# Patient Record
Sex: Male | Born: 1960 | Race: White | Hispanic: No | Marital: Married | Smoking: Current every day smoker
Health system: Southern US, Community
[De-identification: ages and names within clinical notes are randomized; demographics above are authoritative.]

## PROBLEM LIST (undated history)

## (undated) DIAGNOSIS — Z720 Tobacco use: Secondary | ICD-10-CM

## (undated) DIAGNOSIS — C419 Malignant neoplasm of bone and articular cartilage, unspecified: Secondary | ICD-10-CM

## (undated) DIAGNOSIS — C3490 Malignant neoplasm of unspecified part of unspecified bronchus or lung: Secondary | ICD-10-CM

## (undated) DIAGNOSIS — C801 Malignant (primary) neoplasm, unspecified: Secondary | ICD-10-CM

## (undated) HISTORY — DX: Tobacco use: Z72.0

---

## 2001-05-07 ENCOUNTER — Inpatient Hospital Stay (HOSPITAL_COMMUNITY): Admission: EM | Admit: 2001-05-07 | Discharge: 2001-05-09 | Payer: Self-pay | Admitting: Emergency Medicine

## 2001-05-07 ENCOUNTER — Encounter: Payer: Self-pay | Admitting: Emergency Medicine

## 2001-05-07 ENCOUNTER — Encounter: Payer: Self-pay | Admitting: Oral Surgery

## 2001-05-07 ENCOUNTER — Encounter: Payer: Self-pay | Admitting: *Deleted

## 2001-05-08 ENCOUNTER — Encounter: Payer: Self-pay | Admitting: Oral Surgery
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
## 2004-09-08 ENCOUNTER — Emergency Department (HOSPITAL_COMMUNITY): Admission: EM | Admit: 2004-09-08 | Discharge: 2004-09-08 | Payer: Self-pay | Admitting: Emergency Medicine

## 2009-11-26 ENCOUNTER — Emergency Department (HOSPITAL_COMMUNITY): Admission: EM | Admit: 2009-11-26 | Discharge: 2009-11-26 | Payer: Self-pay | Admitting: Emergency Medicine
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
## 2019-11-30 DIAGNOSIS — Z5321 Procedure and treatment not carried out due to patient leaving prior to being seen by health care provider: Secondary | ICD-10-CM | POA: Insufficient documentation

## 2019-11-30 DIAGNOSIS — Y92410 Unspecified street and highway as the place of occurrence of the external cause: Secondary | ICD-10-CM | POA: Insufficient documentation

## 2019-11-30 DIAGNOSIS — M542 Cervicalgia: Secondary | ICD-10-CM | POA: Insufficient documentation
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
## 2019-12-01 ENCOUNTER — Emergency Department
Admission: EM | Admit: 2019-12-01 | Discharge: 2019-12-01 | Disposition: A | Discharge status: Home / Self Care | Payer: Self-pay | Attending: Emergency Medicine | Admitting: Emergency Medicine

## 2019-12-01 ENCOUNTER — Emergency Department: Payer: Self-pay

## 2019-12-01 NOTE — ED Notes (Signed)
Patient reported he was leaving; patient encouraged to stay. Patient left.

## 2022-05-28 ENCOUNTER — Encounter (HOSPITAL_COMMUNITY): Payer: Self-pay

## 2022-05-28 ENCOUNTER — Emergency Department (HOSPITAL_COMMUNITY): Payer: Medicaid Other

## 2022-05-28 ENCOUNTER — Emergency Department (HOSPITAL_COMMUNITY)
Admission: EM | Admit: 2022-05-28 | Discharge: 2022-05-28 | Disposition: A | Discharge status: Home / Self Care | Payer: Medicaid Other | Attending: Emergency Medicine | Admitting: Emergency Medicine

## 2022-05-28 ENCOUNTER — Other Ambulatory Visit: Payer: Self-pay

## 2022-05-28 DIAGNOSIS — E871 Hypo-osmolality and hyponatremia: Secondary | ICD-10-CM | POA: Insufficient documentation

## 2022-05-28 DIAGNOSIS — F17200 Nicotine dependence, unspecified, uncomplicated: Secondary | ICD-10-CM | POA: Insufficient documentation

## 2022-05-28 DIAGNOSIS — R918 Other nonspecific abnormal finding of lung field: Secondary | ICD-10-CM | POA: Insufficient documentation

## 2022-05-28 DIAGNOSIS — M549 Dorsalgia, unspecified: Secondary | ICD-10-CM | POA: Diagnosis present

## 2022-05-28 LAB — CBC WITH DIFFERENTIAL/PLATELET
Abs Immature Granulocytes: 0.08 10*3/uL — ABNORMAL HIGH (ref 0.00–0.07)
Basophils Absolute: 0.2 10*3/uL — ABNORMAL HIGH (ref 0.0–0.1)
Basophils Relative: 1 %
Eosinophils Absolute: 1.2 10*3/uL — ABNORMAL HIGH (ref 0.0–0.5)
Eosinophils Relative: 10 %
HCT: 40.2 % (ref 39.0–52.0)
Hemoglobin: 13.8 g/dL (ref 13.0–17.0)
Immature Granulocytes: 1 %
Lymphocytes Relative: 13 %
Lymphs Abs: 1.6 10*3/uL (ref 0.7–4.0)
MCH: 31.7 pg (ref 26.0–34.0)
MCHC: 34.3 g/dL (ref 30.0–36.0)
MCV: 92.4 fL (ref 80.0–100.0)
Monocytes Absolute: 0.6 10*3/uL (ref 0.1–1.0)
Monocytes Relative: 5 %
Neutro Abs: 8.7 10*3/uL — ABNORMAL HIGH (ref 1.7–7.7)
Neutrophils Relative %: 70 %
Platelets: 525 10*3/uL — ABNORMAL HIGH (ref 150–400)
RBC: 4.35 MIL/uL (ref 4.22–5.81)
RDW: 12.9 % (ref 11.5–15.5)
WBC: 12.3 10*3/uL — ABNORMAL HIGH (ref 4.0–10.5)
nRBC: 0 % (ref 0.0–0.2)

## 2022-05-28 LAB — BASIC METABOLIC PANEL
Anion gap: 10 (ref 5–15)
BUN: 5 mg/dL — ABNORMAL LOW (ref 6–20)
CO2: 27 mmol/L (ref 22–32)
Calcium: 8.5 mg/dL — ABNORMAL LOW (ref 8.9–10.3)
Chloride: 86 mmol/L — ABNORMAL LOW (ref 98–111)
Creatinine, Ser: 0.73 mg/dL (ref 0.61–1.24)
GFR, Estimated: >60 mL/min (ref >60)
Glucose, Bld: 102 mg/dL — ABNORMAL HIGH (ref 70–99)
Potassium: 3.6 mmol/L (ref 3.5–5.1)
Sodium: 123 mmol/L — ABNORMAL LOW (ref 135–145)

## 2022-05-28 LAB — URINALYSIS, ROUTINE W REFLEX MICROSCOPIC
Bilirubin Urine: NEGATIVE
Glucose, UA: NEGATIVE mg/dL
Hgb urine dipstick: NEGATIVE
Ketones, ur: NEGATIVE mg/dL
Leukocytes,Ua: NEGATIVE
Nitrite: NEGATIVE
Protein, ur: NEGATIVE mg/dL
Specific Gravity, Urine: <1.005 — ABNORMAL LOW (ref 1.005–1.030)
pH: 5.5 (ref 5.0–8.0)

## 2022-05-28 MED ORDER — LACTATED RINGERS IV BOLUS
1000.0000 mL | Freq: Once | INTRAVENOUS | Status: AC
  Administered 2022-05-28: 1000 mL via INTRAVENOUS

## 2022-05-28 MED ORDER — IOHEXOL 300 MG/ML  SOLN
75.0000 mL | Freq: Once | INTRAMUSCULAR | Status: AC | PRN
  Administered 2022-05-28: 75 mL via INTRAVENOUS

## 2022-05-28 MED ORDER — KETOROLAC TROMETHAMINE 15 MG/ML IJ SOLN
15.0000 mg | Freq: Once | INTRAMUSCULAR | Status: AC
  Administered 2022-05-28: 15 mg via INTRAVENOUS
  Filled 2022-05-28: qty 1

## 2022-05-28 MED ORDER — OXYCODONE-ACETAMINOPHEN 5-325 MG PO TABS
2.0000 | ORAL_TABLET | Freq: Once | ORAL | Status: AC
  Administered 2022-05-28: 2 via ORAL
  Filled 2022-05-28: qty 2

## 2022-05-28 NOTE — ED Notes (Signed)
Pt ambulated to the bathroom unassisted.  

## 2022-05-28 NOTE — ED Notes (Signed)
ED Provider at bedside. 

## 2022-05-28 NOTE — Discharge Instructions (Signed)
As discussed, your CT scans today have suggested the possibility of cancer.  It is importantly follow-up with our oncology team.  They also have your information and may contact you tomorrow for follow-up care.  If you do not hear from the office tomorrow or Monday, please call to ensure appropriate ongoing care.  Return here for concerning changes in your condition.

## 2022-05-28 NOTE — ED Provider Notes (Signed)
[facility] Provider Note   CSN: [id number] Arrival date & time: 05/28/22  1401     History  Chief Complaint  Patient presents with   Back Pain    [name] Stone is a 62 y.o. male.  HPI Patient presents with his daughter who assists with history.  Patient has been feeling differently from normal for months, though he initially states only about 6 weeks.  He initially complains of left flank pain, but with additional questioning states that he has anorexia, weakness, fatigue, without clear precipitant.  He continues to smoke, drinks daily. He does not see a physician with any frequency.     Home Medications Prior to Admission medications   Medication Sig Start Date End Date Taking? Authorizing Provider  ibuprofen (ADVIL) 200 MG tablet Take 400 mg by mouth every 6 (six) hours as needed for moderate pain.   Yes [provider]      Allergies    Patient has no allergy information on record.    Review of Systems   Review of Systems  All other systems reviewed and are negative.   Physical Exam Updated Vital Signs BP (!) 150/84   Pulse 76   Temp 97.8 F (36.6 C) (Oral)   Resp 14   Ht '5\' 6"'$  (1.676 m)   Wt 59 kg   SpO2 95%   BMI 20.98 kg/m  Physical Exam Vitals and nursing note reviewed.  Constitutional:      Appearance: He is well-developed. He is ill-appearing.  HENT:     Head: Normocephalic and atraumatic.  Eyes:     Conjunctiva/sclera: Conjunctivae normal.  Cardiovascular:     Rate and Rhythm: Normal rate and regular rhythm.  Pulmonary:     Effort: Pulmonary effort is normal. No respiratory distress.     Breath sounds: No stridor.  Abdominal:     General: There is no distension.  Skin:    General: Skin is warm and dry.  Neurological:     Mental Status: He is oriented to person, place, and time.     ED Results / Procedures / Treatments   Labs (all labs ordered are listed, but only abnormal results  are displayed) Labs Reviewed  URINALYSIS, ROUTINE W REFLEX MICROSCOPIC - Abnormal; Notable for the following components:      Result Value   Specific Gravity, Urine <1.005 (*)    All other components within normal limits  CBC WITH DIFFERENTIAL/PLATELET - Abnormal; Notable for the following components:   WBC 12.3 (*)    Platelets 525 (*)    Neutro Abs 8.7 (*)    Eosinophils Absolute 1.2 (*)    Basophils Absolute 0.2 (*)    Abs Immature Granulocytes 0.08 (*)    All other components within normal limits  BASIC METABOLIC PANEL - Abnormal; Notable for the following components:   Sodium 123 (*)    Chloride 86 (*)    Glucose, Bld 102 (*)    BUN 5 (*)    Calcium 8.5 (*)    All other components within normal limits    EKG None  Radiology CT Chest W Contrast  Result Date: 05/28/2022 CLINICAL DATA:  Abnormal lung nodule EXAM: CT CHEST WITH CONTRAST TECHNIQUE: Multidetector CT imaging of the chest was performed during intravenous contrast administration. RADIATION DOSE REDUCTION: This exam was performed according to the departmental dose-optimization program which includes automated exposure control, adjustment of the mA and/or kV according to patient size and/or  use of iterative reconstruction technique. CONTRAST:  56m OMNIPAQUE IOHEXOL 300 MG/ML  SOLN COMPARISON:  CT renal stone 05/28/2022 chest x-ray report only 11/26/2009. FINDINGS: Cardiovascular: No significant vascular findings. Normal heart size. No pericardial effusion. There are atherosclerotic calcifications of the aorta. Mediastinum/Nodes: There is paratracheal lymphadenopathy measuring up to 12 mm short axis. There is subcarinal lymphadenopathy measuring up to 15 mm short axis. There is right hilar lymphadenopathy measuring up to 13 mm short axis. There is anterior mediastinal lymphadenopathy measuring up to 1 cm. The esophagus and visualized thyroid gland are within normal limits. Lungs/Pleura: There are numerous bilateral pulmonary  nodules, right greater than left. Some nodules are adjacent and confluent peripherally. This is most significant in the subpleural region adjacent to the posterior right hemithorax extending from the level of the aortic arch distally into the lung base best seen on coronal image 5/78. There is a small layering right pleural effusion. Largest focal nodule is seen in the right middle lobe measuring 4.4 by 2.7 cm. Largest left-sided pulmonary nodule is seen in the left upper lobe and is cavitary measuring 11 mm image 4/70. Other smaller left-sided pulmonary nodules are all solid. Upper Abdomen: No acute abnormality. Musculoskeletal: There is a lytic lesion in the right fourth rib with adjacent pleural thickening. There is a lytic lesion in the T11 vertebral body with minimal pathologic fracture of the superior endplate. Chronic mild compression deformities of T3, T8 and T9. IMPRESSION: 1. Numerous bilateral pulmonary nodules, right greater than left, worrisome for metastatic disease. 2. There are more confluence larger masslike densities throughout the right lung peripherally which may represent primary neoplasm of the lung or pleura. 3. Small layering right pleural effusion. 4. Mediastinal and right hilar lymphadenopathy. 5. Lytic lesions in the right fourth rib and T11 vertebral body worrisome for metastatic disease. 6. Minimal pathologic fracture of the superior endplate of [id number] Aortic Atherosclerosis (ICD10-I70.0). Electronically Signed   By: ARonney AstersM.D.   On: 05/28/2022 22:23   CT Renal Stone Study  Result Date: 05/28/2022 CLINICAL DATA:  Abdominal/flank pain, stone suspected EXAM: CT ABDOMEN AND PELVIS WITHOUT CONTRAST TECHNIQUE: Multidetector CT imaging of the abdomen and pelvis was performed following the standard protocol without IV contrast. RADIATION DOSE REDUCTION: This exam was performed according to the departmental dose-optimization program which includes automated exposure control, adjustment  of the mA and/or kV according to patient size and/or use of iterative reconstruction technique. COMPARISON:  None Available. FINDINGS: Lower chest: Multiple bilateral pulmonary nodules with as an example a 9 x 8 mm right middle lobe and 10 x 8 mm left lower lobe pulmonary nodules. Subpleural right nodular like densities of the as an example a 25 x 8 mm nodule (2:1). Trace right pleural effusion. Prominent but nonenlarged retrocrural lymph nodes. Hepatobiliary: No focal liver abnormality. No gallstones, gallbladder wall thickening, or pericholecystic fluid. No biliary dilatation. Pancreas: No focal lesion. Normal pancreatic contour. No surrounding inflammatory changes. No main pancreatic ductal dilatation. Spleen: Normal in size without focal abnormality. Adrenals/Urinary Tract: No adrenal nodule bilaterally. No nephrolithiasis and no hydronephrosis. No definite contour-deforming renal mass. No ureterolithiasis or hydroureter. The urinary bladder is unremarkable. Stomach/Bowel: Stomach is within normal limits. No evidence of bowel wall thickening or dilatation. Scattered colonic diverticulosis. Appendix appears normal. Vascular/Lymphatic: No abdominal aorta or iliac aneurysm. Severe atherosclerotic plaque of the aorta and its branches. No abdominal, pelvic, or inguinal lymphadenopathy. Reproductive: Unremarkable prostate gland. Other: No intraperitoneal free fluid. No intraperitoneal free gas. No organized fluid collection.  Musculoskeletal: No abdominal wall hernia or abnormality. Lytic lesion of the L2 vertebral body with associated pathologic compression fracture through the superior endplate leading to a at least 35% vertebral body height loss. Mixed sclerotic and lytic lesion of the L1 lamina and pedicle. IMPRESSION: 1. Findings concerning for metastatic malignancy. 2. Bilateral lung pulmonary nodules as well as right subpleural nodules concerning for metastases. Recommend CT chest with intravenous contrast for  further evaluation. 3. Prominent but nonenlarged retrocrural lymph nodes. 4. Suggestion of osseous metastases with an L2 pathologic compression fracture with underlying lytic lesion. Mixed sclerotic and lytic lesion of the L1 lamina and pedicle. 5. Colonic diverticulosis with no acute diverticulitis. 6.  Aortic Atherosclerosis (ICD10-I70.0). Electronically Signed   By: Iven Finn M.D.   On: 05/28/2022 19:39    Procedures Procedures    Medications Ordered in ED Medications  oxyCODONE-acetaminophen (PERCOCET/ROXICET) 5-325 MG per tablet 2 tablet (2 tablets Oral Given 05/28/22 1954)  ketorolac (TORADOL) 15 MG/ML injection 15 mg (15 mg Intravenous Given 05/28/22 1953)  lactated ringers bolus 1,000 mL (0 mLs Intravenous Stopped 05/28/22 2201)  iohexol (OMNIPAQUE) 300 MG/ML solution 75 mL (75 mLs Intravenous Contrast Given 05/28/22 2151)    ED Course/ Medical Decision Making/ A&P                             Medical Decision Making Adult male, smoker, drinker presents with his daughter.  Patient's complaints are concerning for renal dysfunction initially but with additional systemic complaints, malignancy was a consideration as well as infection. Initial findings concerning for electrolyte abnormalities, possible pulmonary nodules, prompting CT scan. On monitor the patient has cardiac rhythm 75 sinus normal Pulse ox 100% room air normal   Amount and/or Complexity of Data Reviewed Independent Historian: caregiver Labs: ordered. Decision-making details documented in ED Course. Radiology: ordered and independent interpretation performed. Decision-making details documented in ED Course.  Risk Prescription drug management. Decision regarding hospitalization.   11:33 PM Patient calm, awake, alert, ambulatory, in no distress, no new oxygen requirement, no hemodynamic instability.  I reviewed his labs, x-ray, CT with him and his daughter.  We had a lengthy conversation about the likelihood of  malignancy based on CT scan findings. Patient does have mild hyponatremia, most values are otherwise generally reassuring and the hyponatremia is likely chronic given the absence of any focal neurodeficits, and his description of essential poor nutrition during his illness.  Patient's CT scan is also notable for multiple areas of likely metastatic disease, including possible bone involvement, likely contributing to his description of pain on presentation. We discussed the importance of close outpatient follow-up to further delineate possible malignancy.  I have messaged our oncology colleague to attempt to increase the likelihood of follow-up as well.        Final Clinical Impression(s) / ED Diagnoses Final diagnoses:  Lung mass    Rx / DC Orders ED Discharge Orders     None         Carmin Muskrat, MD 05/28/22 2333

## 2022-05-28 NOTE — ED Triage Notes (Signed)
Complaining of back pain on the rt side at the level of the kidneys that started a month ago and is sharp now.  Denies any blood or dark urine.

## 2022-05-28 NOTE — ED Provider Triage Note (Signed)
Emergency Medicine Provider Triage Evaluation Note  [name] Stone , a 62 y.o. male  was evaluated in triage.  Pt complains of right lower back pain has been ongoing for couple days intermittently.  Described as a sharp sensation.  Patient denies any history of kidney stones but his daughter does suffer from kidney stones.  He does have urinary urgency but denies any dysuria, hematuria, or frequency.  Pain does not radiate.  No nausea or vomiting.  Review of Systems  Positive:  Negative: See above   Physical Exam  BP (!) 147/94 (BP Location: Right Arm)   Pulse 76   Temp 97.6 F (36.4 C) (Oral)   Resp 18   Ht '5\' 6"'$  (1.676 m)   Wt 59 kg   SpO2 100%   BMI 20.98 kg/m  Gen:   Awake, no distress   Resp:  Normal effort  MSK:   Moves extremities without difficulty  Other:  Right CVA tenderness.  No midline lumbar tenderness.  Medical Decision Making  Medically screening exam initiated at 5:58 PM.  Appropriate orders placed.  Kirke Shaggy was informed that the remainder of the evaluation will be completed by another provider, this initial triage assessment does not replace that evaluation, and the importance of remaining in the ED until their evaluation is complete.     Myna Bright [place], [place] 05/28/22 1759

## 2022-05-28 NOTE — ED Notes (Signed)
Pt back from CT. Pt ambulated to the bathroom unassisted and back

## 2022-06-01 ENCOUNTER — Other Ambulatory Visit: Payer: Self-pay

## 2022-06-01 DIAGNOSIS — C3490 Malignant neoplasm of unspecified part of unspecified bronchus or lung: Secondary | ICD-10-CM

## 2022-06-01 NOTE — Progress Notes (Signed)
Arne Cleveland, MD  Derek Jack, MD Cc: Donita Brooks D Do you plan PET-CT? Would be helpful to assess for safer biopsy sites than lung  Thx DDH

## 2022-06-01 NOTE — Progress Notes (Signed)
Order placed for PET scan and brain MRI per Dr. Delton Coombes

## 2022-06-01 NOTE — Progress Notes (Signed)
Order placed for CT biopsy per Dr. Katragadda. 

## 2022-06-02 ENCOUNTER — Ambulatory Visit (HOSPITAL_COMMUNITY)
Admission: RE | Admit: 2022-06-02 | Discharge: 2022-06-02 | Disposition: A | Discharge status: Home / Self Care | Payer: Medicaid Other | Source: Ambulatory Visit | Attending: Hematology | Admitting: Hematology

## 2022-06-02 DIAGNOSIS — C3490 Malignant neoplasm of unspecified part of unspecified bronchus or lung: Secondary | ICD-10-CM | POA: Diagnosis not present

## 2022-06-02 MED ORDER — GADOBUTROL 1 MMOL/ML IV SOLN
6.5000 mL | Freq: Once | INTRAVENOUS | Status: AC | PRN
  Administered 2022-06-02: 6.5 mL via INTRAVENOUS

## 2022-06-04 ENCOUNTER — Ambulatory Visit (HOSPITAL_COMMUNITY)
Admission: RE | Admit: 2022-06-04 | Discharge: 2022-06-04 | Disposition: A | Discharge status: Home / Self Care | Payer: Medicaid Other | Source: Ambulatory Visit | Attending: Hematology | Admitting: Hematology

## 2022-06-04 DIAGNOSIS — C3490 Malignant neoplasm of unspecified part of unspecified bronchus or lung: Secondary | ICD-10-CM | POA: Insufficient documentation

## 2022-06-04 MED ORDER — FLUDEOXYGLUCOSE F - 18 (FDG) INJECTION
7.0300 | Freq: Once | INTRAVENOUS | Status: AC | PRN
  Administered 2022-06-04: 7.03 via INTRAVENOUS

## 2022-06-07 DIAGNOSIS — C799 Secondary malignant neoplasm of unspecified site: Secondary | ICD-10-CM | POA: Insufficient documentation

## 2022-06-08 ENCOUNTER — Inpatient Hospital Stay: Payer: Medicaid Other | Attending: Hematology | Admitting: Hematology

## 2022-06-08 ENCOUNTER — Encounter: Payer: Self-pay | Admitting: Hematology

## 2022-06-08 VITALS — BP 186/93 | HR 68 | Temp 97.0°F | Resp 18 | Ht 66.0 in | Wt 109.0 lb

## 2022-06-08 DIAGNOSIS — C799 Secondary malignant neoplasm of unspecified site: Secondary | ICD-10-CM

## 2022-06-08 DIAGNOSIS — J90 Pleural effusion, not elsewhere classified: Secondary | ICD-10-CM | POA: Diagnosis not present

## 2022-06-08 DIAGNOSIS — F17210 Nicotine dependence, cigarettes, uncomplicated: Secondary | ICD-10-CM | POA: Diagnosis not present

## 2022-06-08 DIAGNOSIS — R39198 Other difficulties with micturition: Secondary | ICD-10-CM | POA: Diagnosis not present

## 2022-06-08 DIAGNOSIS — Z801 Family history of malignant neoplasm of trachea, bronchus and lung: Secondary | ICD-10-CM | POA: Insufficient documentation

## 2022-06-08 DIAGNOSIS — C7951 Secondary malignant neoplasm of bone: Secondary | ICD-10-CM | POA: Diagnosis not present

## 2022-06-08 DIAGNOSIS — C3490 Malignant neoplasm of unspecified part of unspecified bronchus or lung: Secondary | ICD-10-CM | POA: Insufficient documentation

## 2022-06-08 LAB — BASIC METABOLIC PANEL
Anion gap: 8 (ref 5–15)
BUN: 6 mg/dL (ref 6–20)
CO2: 27 mmol/L (ref 22–32)
Calcium: 8.4 mg/dL — ABNORMAL LOW (ref 8.9–10.3)
Chloride: 93 mmol/L — ABNORMAL LOW (ref 98–111)
Creatinine, Ser: 0.75 mg/dL (ref 0.61–1.24)
GFR, Estimated: >60 mL/min (ref >60)
Glucose, Bld: 95 mg/dL (ref 70–99)
Potassium: 4.4 mmol/L (ref 3.5–5.1)
Sodium: 128 mmol/L — ABNORMAL LOW (ref 135–145)

## 2022-06-08 LAB — OSMOLALITY: Osmolality: 278 mOsm/kg (ref 275–295)

## 2022-06-08 MED ORDER — TAMSULOSIN HCL 0.4 MG PO CAPS: 0.4000 mg | ORAL_CAPSULE | Freq: Every day | ORAL | 1 refills | Status: AC

## 2022-06-08 NOTE — Patient Instructions (Addendum)
[facility]  Discharge Instructions   You were seen and examined today by Dr. Delton Coombes. Dr. Delton Coombes is a medical oncologist, meaning that he specializes in the treatment of cancer diagnoses. Dr. Delton Coombes discussed your past medical history, family history of cancers, and the events that led to you being here today.  You were referred to Dr. Delton Coombes due to an abnormal CT scan that resulted in a PET scan and brain MRI.   The PET scan revealed numerous nodules in your lungs, lymph nodes and bones. This is highly concerning for cancer.  Your brain MRI was clean. There is no signs of cancer spread to the brain.  Dr. Delton Coombes has recommended a biopsy. We will arrange for this as quickly as possible. Following the biopsy, Dr. Delton Coombes has recommended additional testing on the tissue removed.  Dr. Delton Coombes has recommended additional blood work today.  Follow-up as scheduled.  Thank you for choosing [facility] to provide your oncology and hematology care.   To afford each patient quality time with our provider, please arrive at least 15 minutes before your scheduled appointment time. You may need to reschedule your appointment if you arrive late (10 or more minutes). Arriving late affects you and other patients whose appointments are after yours.  Also, if you miss three or more appointments without notifying the office, you may be dismissed from the clinic at the provider's discretion.    Again, thank you for choosing [facility].  Our hope is that these requests will decrease the amount of time that you wait before being seen by our physicians.   If you have a lab appointment with the [facility] please come in thru the Main Entrance and check in at the main information desk.           _____________________________________________________________  Should you have questions after your visit to [facility], please contact our office at [phone] and follow the prompts.  Our office hours are 8:00 a.m. to 4:30 p.m. Monday - Thursday and 8:00 a.m. to 2:30 p.m. Friday.  Please note that voicemails left after 4:00 p.m. may not be returned until the following business day.  We are closed weekends and all major holidays.  You do have access to a nurse 24-7, just call the main number to the clinic [phone] and do not press any options, hold on the line and a nurse will answer the phone.    For prescription refill requests, have your pharmacy contact our office and allow 72 hours.    Masks are optional in the cancer centers. If you would like for your care team to wear a mask while they are taking care of you, please let them know. You may have one support person who is at least 61 years old accompany you for your appointments.

## 2022-06-08 NOTE — Progress Notes (Signed)
[name] [address], [place] [address]   Clinic Day:  06/08/2022  Referring physician: No ref. provider found  Patient Care Team: Patient, No Pcp Per as PCP - General (General Practice) Derek Jack, MD as Medical Oncologist (Medical Oncology) Brien Mates, RN as Oncology Nurse Navigator (Medical Oncology)   ASSESSMENT & PLAN:   Assessment:  1.  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  He was seen in the ER on 05/28/2022.  20 pound weight loss in 6 months.  No hemoptysis. - CT renal study (05/28/2022): Bilateral lung nodules, L2 pathologic compression fracture, L1 lamina and pedicle lesion. - CT chest (05/28/2022): Numerous bilateral lung nodules, right more than left.  Pleural thickening concerning for primary neoplasm of the lung or pleura.  Small layering right pleural effusion.  Mediastinal and right hilar lymphadenopathy.  Lytic lesions in the right fourth rib and T11 vertebral body. - MRI brain (06/02/2022): No metastatic disease. - PET scan (06/08/2022): Innumerable bilateral hypermetabolic lung nodules, extensive right pleural nodularity/pleural rind.  Hypermetabolic right supraclavicular, right axillary, right hilar, infrahilar lower paraesophageal adenopathy.  Hypermetabolic bone lesions-right anterior fourth rib, T11 vertebral body, L1 vertebral body, L2 vertebral body.  L2 lesion has posterior cortical breakthrough and suspected small amount of epidural tumor.  2.  Social/family history: - He lives by himself at home and is independent of ADLs and IADLs.  He is a retired Engineer, building services.  Current active smoker, 1 pack/day for 40 years.  No exposure to asbestos. - Father died of lung cancer and was smoker.  Paternal uncle had cancer, type not known to the patient.  Plan:  1.  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 - Recommend biopsy of the right supraclavicular/axillary lymph node, US guided by IR. - Recommend NGS testing.  Will also send blood NGS testing. - RTC after above.  2.  Low back pain: - Will make a referral to radiation oncology.  He will require MRI of the lumbar spine with and without contrast.  2.  Difficulty urination: - Will start him on Flomax 0.4 mg daily.   Orders Placed This Encounter  Procedures   Basic metabolic panel    Standing Status:   Future    Number of Occurrences:   1    Standing Expiration Date:   06/08/2023   Osmolality    Standing Status:   Future    Number of Occurrences:   1    Standing Expiration Date:   06/08/2023     I,Alexis Herring,acting as a scribe for Derek Jack, MD.,have documented all relevant documentation on the behalf of Derek Jack, MD,as directed by  Derek Jack, MD while in the presence of Derek Jack, MD.   I, Derek Jack MD, have reviewed the above documentation for accuracy and completeness, and I agree with the above.   Derek Jack, MD   3/25/202412:33 PM  CHIEF COMPLAINT/PURPOSE OF CONSULT:   Diagnosis: lung nodules with osseous lytic lesion   Cancer Staging  No matching staging information was found for the patient.   Prior Therapy: none  Current Therapy:  pending work-up  HISTORY OF PRESENT ILLNESS:   Oncology History   No history exists.     [name] Stone is a 62 y.o. male presenting to clinic today for evaluation of lung  nodules at the request of EM Dr. Carmin Muskrat.  Patient was seen in the ED on 05/28/22 for back pain with accompanying decreased appetite, fatigue, and weakness x2-3 months. While there he had a CT renal stone scan which revealed bilateral lung pulmonary nodules as well as right subpleural nodules concerning for metastases and suggestion of osseous metastases with an L2 pathologic compression fracture with underlying lytic lesion. While  in the ED his sodium was 123. He received 1L of lactated ringers at that time.  Today, he reports that's he has had this back pain for the last several months but it has worsened in the last few weeks. He reports that he has lost 20lbs unintentionally in the last 6 months. However, he reports having a good appetite. His appetite level is at 80%. His energy level is at 40%. He reports that he has had difficulty urinating for the last few weeks. He states that he stays well hydrated. Patient states that he has not noticed much of a change in his energy levels since losing the weight. He denies any chest pain, hemoptysis, headaches, vision changes, recent infections, numbness or tingling in his hands or feet, nausea, or vomiting.  He takes Advil for his back pain but does not take any other medications on a regular basis. He states that when his pain becomes severe he can only sleep for a few hours at a time.  He lives at home alone. He is able to perform all of his ADLs independently- he drives, shops, and does chores without assistance.  He smokes 1ppd and started smoking 40 years. He denies any known asbestos exposures.   His father passed away from lung cancer. His paternal uncle has an unknown type of cancer. He denies any other known family history of cancer.  He denies any Hx of heart attacks or strokes.  He previously worked as a Engineer, building services.  PAST MEDICAL HISTORY:   Past Medical History: Past Medical History:  Diagnosis Date   Tobacco abuse     Surgical History: No past surgical history on file.  Social History: Social History   Socioeconomic History   Marital status: Married    Spouse name: Not on file   Number of children: Not on file   Years of education: Not on file   Highest education level: Not on file  Occupational History   Not on file  Tobacco Use   Smoking status: Every Day    Packs/day: 1    Types: Cigarettes   Smokeless tobacco: Not on file   Substance and Sexual Activity   Alcohol use: Not on file   Drug use: Not on file   Sexual activity: Not on file  Other Topics Concern   Not on file  Social History Narrative   Not on file   Social Determinants of Health   Financial Resource Strain: Not on file  Food Insecurity: No Food Insecurity (06/08/2022)   Hunger Vital Sign    Worried About Running Out of Food in the Last Year: Never true    Ran Out of Food in the Last Year: Never true  Transportation Needs: No Transportation Needs (06/08/2022)   PRAPARE - Hydrologist (Medical): No    Lack of Transportation (Non-Medical): No  Physical Activity: Not on file  Stress: Not on file  Social Connections: Not on file  Intimate Partner Violence: Not At Risk (06/08/2022)   Humiliation, Afraid, Rape, and Kick questionnaire  Fear of Current or Ex-Partner: No    Emotionally Abused: No    Physically Abused: No    Sexually Abused: No    Family History: No family history on file.  Current Medications:  Current Outpatient Medications:    tamsulosin (FLOMAX) 0.4 MG CAPS capsule, Take 1 capsule (0.4 mg total) by mouth daily after supper., Disp: 30 capsule, Rfl: 1   ibuprofen (ADVIL) 200 MG tablet, Take 400 mg by mouth every 6 (six) hours as needed for moderate pain., Disp: , Rfl:    Allergies: Not on File  REVIEW OF SYSTEMS:   Review of Systems  Constitutional:  Positive for fatigue and unexpected weight change. Negative for chills and fever.  HENT:   Negative for lump/mass, mouth sores, nosebleeds, sore throat and trouble swallowing.   Eyes:  Negative for eye problems.  Respiratory:  Negative for cough and shortness of breath.   Cardiovascular:  Negative for chest pain, leg swelling and palpitations.  Gastrointestinal:  Negative for abdominal pain, constipation, diarrhea, nausea and vomiting.  Genitourinary:  Positive for difficulty urinating. Negative for bladder incontinence, dysuria, frequency,  hematuria and nocturia.   Musculoskeletal:  Positive for back pain. Negative for arthralgias, flank pain, myalgias and neck pain.  Skin:  Negative for itching and rash.  Neurological:  Negative for dizziness, headaches and numbness.  Hematological:  Does not bruise/bleed easily.  Psychiatric/Behavioral:  Positive for sleep disturbance. Negative for depression and suicidal ideas. The patient is not nervous/anxious.   All other systems reviewed and are negative.    VITALS:   Blood pressure (!) 186/93, pulse 68, temperature (!) 97 F (36.1 C), temperature source Tympanic, resp. rate 18, height 5\' 6"  (1.676 m), weight 109 lb (49.4 kg), SpO2 100 %.  Wt Readings from Last 3 Encounters:  06/08/22 109 lb (49.4 kg)  05/28/22 130 lb (59 kg)    Body mass index is 17.59 kg/m.  Performance status (ECOG): 1 - Symptomatic but completely ambulatory  PHYSICAL EXAM:   Physical Exam Vitals and nursing note reviewed. Exam conducted with a chaperone present.  Constitutional:      Appearance: Normal appearance.  Cardiovascular:     Rate and Rhythm: Normal rate and regular rhythm.     Pulses: Normal pulses.     Heart sounds: Normal heart sounds.  Pulmonary:     Effort: Pulmonary effort is normal.     Breath sounds: Normal breath sounds.  Abdominal:     Palpations: Abdomen is soft. There is no hepatomegaly, splenomegaly or mass.     Tenderness: There is no abdominal tenderness.  Musculoskeletal:     Right lower leg: No edema.     Left lower leg: No edema.  Lymphadenopathy:     Cervical: No cervical adenopathy.     Right cervical: No superficial, deep or posterior cervical adenopathy.    Left cervical: No superficial, deep or posterior cervical adenopathy.     Upper Body:     Right upper body: Axillary adenopathy present. No supraclavicular adenopathy.     Left upper body: No supraclavicular or axillary adenopathy.     Comments: Right supraclavicular lymph node not palpable Small 1cm right  axillary lymph node  Neurological:     General: No focal deficit present.     Mental Status: He is alert and oriented to person, place, and time.  Psychiatric:        Mood and Affect: Mood normal.        Behavior: Behavior normal.  LABS:      Latest Ref Rng & Units 05/28/2022    6:21 PM  CBC  WBC 4.0 - 10.5 K/uL 12.3   Hemoglobin 13.0 - 17.0 g/dL 13.8   Hematocrit 39.0 - 52.0 % 40.2   Platelets 150 - 400 K/uL 525       Latest Ref Rng & Units 06/08/2022    8:53 AM 05/28/2022    6:21 PM  CMP  Glucose 70 - 99 mg/dL 95  102   BUN 6 - 20 mg/dL 6  5   Creatinine 0.61 - 1.24 mg/dL 0.75  0.73   Sodium 135 - 145 mmol/L 128  123   Potassium 3.5 - 5.1 mmol/L 4.4  3.6   Chloride 98 - 111 mmol/L 93  86   CO2 22 - 32 mmol/L 27  27   Calcium 8.9 - 10.3 mg/dL 8.4  8.5      No results found for: "CEA1", "CEA" / No results found for: "CEA1", "CEA" No results found for: "PSA1" No results found for: "[id number]" No results found for: "CAN125"  No results found for: "TOTALPROTELP", "ALBUMINELP", "A1GS", "A2GS", "BETS", "BETA2SER", "GAMS", "MSPIKE", "SPEI" No results found for: "TIBC", "FERRITIN", "IRONPCTSAT" No results found for: "LDH"   STUDIES:   NM PET Image Initial (PI) Skull Base To Thigh  Addendum Date: 06/05/2022   ADDENDUM REPORT: 06/05/2022 14:16 ADDENDUM: The original report was by Dr. Van Clines. The following addendum is by Dr. Van Clines: Please note that activity in the right hand and wrist region does not have an accompanying CT abnormality and is thought to be injection related, although the FDG injection is documented as being in the LEFT hand. Given the normal and symmetric appearance in this region on the CT data I am highly skeptical of tumor involvement in the right hand and wrist, and would only suggest follow up imaging such as MRI if the patient has specific symptoms related to the right hand/wrist. Electronically Signed   By: Van Clines M.D.    On: 06/05/2022 14:16   Result Date: 06/05/2022 CLINICAL DATA:  Initial treatment strategy for non-small cell lung cancer. EXAM: NUCLEAR MEDICINE PET SKULL BASE TO THIGH TECHNIQUE: 7.0 mCi F-18 FDG was injected intravenously. Full-ring PET imaging was performed from the skull base to thigh after the radiotracer. CT data was obtained and used for attenuation correction and anatomic localization. Fasting blood glucose: 82 mg/dl COMPARISON:  Multiple exams, including CT examinations from 05/28/2022 FINDINGS: Mediastinal blood pool activity: SUV max 2.6 Liver activity: SUV max NA NECK: No significant abnormal hypermetabolic activity in this region. Incidental CT findings: Periventricular white matter and corona radiata hypodensities favor chronic ischemic microvascular white matter disease. Bilateral common carotid atherosclerotic calcification. CHEST: Innumerable hypermetabolic bilateral pulmonary nodules with extensive right pleural nodularity with rind like and nodular pleural densities on the right compatible with malignant pleural involvement right upper lobe medial paramediastinal nodule 2.7 by 1.8 cm with maximum SUV 13.8. Right lower lobe posteromedial pleural rind measures 6.8 by 1.4 cm on image 78 series 7 with maximum SUV 19.7. Index 1 cm left lower lobe pulmonary nodule on image 91 series 7 has maximum SUV of 6.5. Hypermetabolic right supraclavicular, paratracheal, right axillary, right hilar, right infrahilar, and right lower paraesophageal adenopathy noted. The index right paratracheal node measuring 1.2 cm in short axis on image 58 series 7 has maximum SUV of 17.0. Low-grade activity along the trace right pleural effusion which is probably malignant. Incidental CT findings: Atherosclerotic  calcification of the thoracic aorta and branch vessels. Minimal atherosclerotic calcification of the left main coronary artery. ABDOMEN/PELVIS: No significant abnormal hypermetabolic activity in this region. Incidental  CT findings: Atherosclerosis is present, including aortoiliac atherosclerotic disease. SKELETON: Several hypermetabolic osseous metastatic lesions are observed. A destructive lesion of the right anterior fourth rib has maximum SUV of 13.7 cm. A mixed density lesion of the T11 vertebral body has a maximum SUV of 19.8. A mixed density but partially destructive lesion of the right L1 pedicle, transverse process, spinous process, and lamina has maximum SUV of 12.8. A mixed density but primarily lytic lesion of the left L2 vertebral body and pedicle has a maximum SUV of 15.2 with posterior cortical breakthrough and a suspected small amount of epidural tumor. Activity in the right hand and wrist is thought to be injection related. Incidental CT findings: Prior compressions at T9 and L2, the L2 compression of the superior endplate may be pathologic in nature. Old healed sternal body fracture. IMPRESSION: 1. Innumerable bilateral hypermetabolic pulmonary nodules and extensive right pleural nodularity/pleural rind compatible with malignant pleural involvement. 2. Hypermetabolic right supraclavicular, right axillary, right hilar, right infrahilar, and right lower paraesophageal adenopathy. 3. Hypermetabolic osseous metastatic lesions involving the right anterior fourth rib, T11 vertebral body, L1 vertebral body, and L2 vertebral body. The L2 lesion has posterior cortical breakthrough and a suspected small amount of epidural tumor. 4. Trace right pleural effusion, probably malignant. 5. Periventricular white matter and corona radiata hypodensities favoring chronic ischemic microvascular white matter disease. 6. Atherosclerosis. 7. Old T9 and L2 compression fractures. Old healed sternal body fracture. Aortic Atherosclerosis (ICD10-I70.0). Electronically Signed: By: Van Clines M.D. On: 06/05/2022 11:57   MR Brain W Wo Contrast  Result Date: 06/05/2022 CLINICAL DATA:  Non-small cell lung cancer (NSCLC), staging EXAM:  MRI HEAD WITHOUT AND WITH CONTRAST TECHNIQUE: Multiplanar, multiecho pulse sequences of the brain and surrounding structures were obtained without and with intravenous contrast. CONTRAST:  6.106mL GADAVIST GADOBUTROL 1 MMOL/ML IV SOLN COMPARISON:  CT head May 07, 2001. FINDINGS: Brain: No acute infarction, hemorrhage, hydrocephalus, extra-axial collection or mass lesion. Small remote lacunar infarcts in the right thalamus. Small remote perforator infarct in the left basal ganglia. Patchy T2/FLAIR hyperintensity within the white matter, compatible with chronic microvascular ischemic change. No pathologic enhancement. Vascular: Major arterial flow voids are maintained skull base. Skull and upper cervical spine: Normal marrow signal. Sinuses/Orbits: Negative. Other: Nonspecific cyst along the left nasal arch, likely benign. IMPRESSION: No evidence of acute intracranial abnormality or metastatic disease. Electronically Signed   By: Margaretha Sheffield M.D.   On: 06/05/2022 11:37   CT Chest W Contrast  Result Date: 05/28/2022 CLINICAL DATA:  Abnormal lung nodule EXAM: CT CHEST WITH CONTRAST TECHNIQUE: Multidetector CT imaging of the chest was performed during intravenous contrast administration. RADIATION DOSE REDUCTION: This exam was performed according to the departmental dose-optimization program which includes automated exposure control, adjustment of the mA and/or kV according to patient size and/or use of iterative reconstruction technique. CONTRAST:  51mL OMNIPAQUE IOHEXOL 300 MG/ML  SOLN COMPARISON:  CT renal stone 05/28/2022 chest x-ray report only 11/26/2009. FINDINGS: Cardiovascular: No significant vascular findings. Normal heart size. No pericardial effusion. There are atherosclerotic calcifications of the aorta. Mediastinum/Nodes: There is paratracheal lymphadenopathy measuring up to 12 mm short axis. There is subcarinal lymphadenopathy measuring up to 15 mm short axis. There is right hilar  lymphadenopathy measuring up to 13 mm short axis. There is anterior mediastinal lymphadenopathy measuring up to 1  cm. The esophagus and visualized thyroid gland are within normal limits. Lungs/Pleura: There are numerous bilateral pulmonary nodules, right greater than left. Some nodules are adjacent and confluent peripherally. This is most significant in the subpleural region adjacent to the posterior right hemithorax extending from the level of the aortic arch distally into the lung base best seen on coronal image 5/78. There is a small layering right pleural effusion. Largest focal nodule is seen in the right middle lobe measuring 4.4 by 2.7 cm. Largest left-sided pulmonary nodule is seen in the left upper lobe and is cavitary measuring 11 mm image 4/70. Other smaller left-sided pulmonary nodules are all solid. Upper Abdomen: No acute abnormality. Musculoskeletal: There is a lytic lesion in the right fourth rib with adjacent pleural thickening. There is a lytic lesion in the T11 vertebral body with minimal pathologic fracture of the superior endplate. Chronic mild compression deformities of T3, T8 and T9. IMPRESSION: 1. Numerous bilateral pulmonary nodules, right greater than left, worrisome for metastatic disease. 2. There are more confluence larger masslike densities throughout the right lung peripherally which may represent primary neoplasm of the lung or pleura. 3. Small layering right pleural effusion. 4. Mediastinal and right hilar lymphadenopathy. 5. Lytic lesions in the right fourth rib and T11 vertebral body worrisome for metastatic disease. 6. Minimal pathologic fracture of the superior endplate of 624THL. Aortic Atherosclerosis (ICD10-I70.0). Electronically Signed   By: Ronney Asters M.D.   On: 05/28/2022 22:23   CT Renal Stone Study  Result Date: 05/28/2022 CLINICAL DATA:  Abdominal/flank pain, stone suspected EXAM: CT ABDOMEN AND PELVIS WITHOUT CONTRAST TECHNIQUE: Multidetector CT imaging of the  abdomen and pelvis was performed following the standard protocol without IV contrast. RADIATION DOSE REDUCTION: This exam was performed according to the departmental dose-optimization program which includes automated exposure control, adjustment of the mA and/or kV according to patient size and/or use of iterative reconstruction technique. COMPARISON:  None Available. FINDINGS: Lower chest: Multiple bilateral pulmonary nodules with as an example a 9 x 8 mm right middle lobe and 10 x 8 mm left lower lobe pulmonary nodules. Subpleural right nodular like densities of the as an example a 25 x 8 mm nodule (2:1). Trace right pleural effusion. Prominent but nonenlarged retrocrural lymph nodes. Hepatobiliary: No focal liver abnormality. No gallstones, gallbladder wall thickening, or pericholecystic fluid. No biliary dilatation. Pancreas: No focal lesion. Normal pancreatic contour. No surrounding inflammatory changes. No main pancreatic ductal dilatation. Spleen: Normal in size without focal abnormality. Adrenals/Urinary Tract: No adrenal nodule bilaterally. No nephrolithiasis and no hydronephrosis. No definite contour-deforming renal mass. No ureterolithiasis or hydroureter. The urinary bladder is unremarkable. Stomach/Bowel: Stomach is within normal limits. No evidence of bowel wall thickening or dilatation. Scattered colonic diverticulosis. Appendix appears normal. Vascular/Lymphatic: No abdominal aorta or iliac aneurysm. Severe atherosclerotic plaque of the aorta and its branches. No abdominal, pelvic, or inguinal lymphadenopathy. Reproductive: Unremarkable prostate gland. Other: No intraperitoneal free fluid. No intraperitoneal free gas. No organized fluid collection. Musculoskeletal: No abdominal wall hernia or abnormality. Lytic lesion of the L2 vertebral body with associated pathologic compression fracture through the superior endplate leading to a at least 35% vertebral body height loss. Mixed sclerotic and lytic  lesion of the L1 lamina and pedicle. IMPRESSION: 1. Findings concerning for metastatic malignancy. 2. Bilateral lung pulmonary nodules as well as right subpleural nodules concerning for metastases. Recommend CT chest with intravenous contrast for further evaluation. 3. Prominent but nonenlarged retrocrural lymph nodes. 4. Suggestion of osseous metastases with an  L2 pathologic compression fracture with underlying lytic lesion. Mixed sclerotic and lytic lesion of the L1 lamina and pedicle. 5. Colonic diverticulosis with no acute diverticulitis. 6.  Aortic Atherosclerosis (ICD10-I70.0). Electronically Signed   By: Iven Finn M.D.   On: 05/28/2022 19:39

## 2022-06-11 NOTE — Progress Notes (Signed)
Arne Cleveland, MD  Donita Brooks D PROCEDURE / BIOPSY REVIEW Date: 06/10/22  Requested Biopsy site: Pleural mass Reason for request: r/o met, staging, molecular studies Imaging review: Best seen on CT  Decision: Approved Imaging modality to perform: CT Schedule with: Moderate Sedation Schedule for: Any VIR  Additional comments:   Please contact me with questions, concerns, or if issue pertaining to this request arise.  Dayne Arne Cleveland, MD [facility]

## 2022-06-24 ENCOUNTER — Other Ambulatory Visit: Payer: Self-pay | Admitting: Student

## 2022-06-24 ENCOUNTER — Other Ambulatory Visit: Payer: Self-pay | Admitting: Radiology

## 2022-06-24 DIAGNOSIS — R918 Other nonspecific abnormal finding of lung field: Secondary | ICD-10-CM

## 2022-06-25 ENCOUNTER — Ambulatory Visit (HOSPITAL_COMMUNITY)
Admission: RE | Admit: 2022-06-25 | Discharge: 2022-06-25 | Disposition: A | Discharge status: Home / Self Care | Payer: Medicaid Other | Source: Ambulatory Visit | Attending: Interventional Radiology | Admitting: Interventional Radiology

## 2022-06-25 ENCOUNTER — Ambulatory Visit (HOSPITAL_COMMUNITY): Admission: RE | Admit: 2022-06-25 | Payer: Medicaid Other | Source: Ambulatory Visit

## 2022-06-25 ENCOUNTER — Other Ambulatory Visit: Payer: Self-pay

## 2022-06-25 ENCOUNTER — Ambulatory Visit (HOSPITAL_COMMUNITY)
Admission: RE | Admit: 2022-06-25 | Discharge: 2022-06-25 | Disposition: A | Discharge status: Home / Self Care | Payer: Medicaid Other | Source: Ambulatory Visit | Attending: Hematology | Admitting: Hematology

## 2022-06-25 DIAGNOSIS — F17210 Nicotine dependence, cigarettes, uncomplicated: Secondary | ICD-10-CM | POA: Insufficient documentation

## 2022-06-25 DIAGNOSIS — C3491 Malignant neoplasm of unspecified part of right bronchus or lung: Secondary | ICD-10-CM | POA: Diagnosis not present

## 2022-06-25 DIAGNOSIS — R918 Other nonspecific abnormal finding of lung field: Secondary | ICD-10-CM | POA: Diagnosis present

## 2022-06-25 DIAGNOSIS — C3490 Malignant neoplasm of unspecified part of unspecified bronchus or lung: Secondary | ICD-10-CM

## 2022-06-25 LAB — PROTIME-INR
INR: 1.1 (ref 0.8–1.2)
Prothrombin Time: 13.8 seconds (ref 11.4–15.2)

## 2022-06-25 LAB — CBC
HCT: 40.3 % (ref 39.0–52.0)
Hemoglobin: 13.7 g/dL (ref 13.0–17.0)
MCH: 31.4 pg (ref 26.0–34.0)
MCHC: 34 g/dL (ref 30.0–36.0)
MCV: 92.2 fL (ref 80.0–100.0)
Platelets: 316 10*3/uL (ref 150–400)
RBC: 4.37 MIL/uL (ref 4.22–5.81)
RDW: 14.2 % (ref 11.5–15.5)
WBC: 10.6 10*3/uL — ABNORMAL HIGH (ref 4.0–10.5)
nRBC: 0 % (ref 0.0–0.2)

## 2022-06-25 MED ORDER — FENTANYL CITRATE (PF) 100 MCG/2ML IJ SOLN
INTRAMUSCULAR | Status: AC
  Filled 2022-06-25: qty 2

## 2022-06-25 MED ORDER — METOPROLOL TARTRATE 5 MG/5ML IV SOLN
INTRAVENOUS | Status: AC
  Filled 2022-06-25: qty 5

## 2022-06-25 MED ORDER — HYDRALAZINE HCL 20 MG/ML IJ SOLN
INTRAMUSCULAR | Status: AC
  Filled 2022-06-25: qty 1

## 2022-06-25 MED ORDER — FENTANYL CITRATE (PF) 100 MCG/2ML IJ SOLN
INTRAMUSCULAR | Status: AC | PRN
  Administered 2022-06-25: 25 ug via INTRAVENOUS

## 2022-06-25 MED ORDER — MIDAZOLAM HCL 2 MG/2ML IJ SOLN
INTRAMUSCULAR | Status: AC | PRN
  Administered 2022-06-25: .5 mg via INTRAVENOUS

## 2022-06-25 MED ORDER — METOPROLOL TARTRATE 50 MG PO TABS
ORAL_TABLET | ORAL | Status: AC
  Filled 2022-06-25: qty 2

## 2022-06-25 MED ORDER — SODIUM CHLORIDE 0.9 % IV SOLN: INTRAVENOUS | Status: DC

## 2022-06-25 MED ORDER — MIDAZOLAM HCL 2 MG/2ML IJ SOLN
INTRAMUSCULAR | Status: AC
  Filled 2022-06-25: qty 2

## 2022-06-25 MED ORDER — METOPROLOL TARTRATE 5 MG/5ML IV SOLN
5.0000 mg | Freq: Once | INTRAVENOUS | Status: AC
  Administered 2022-06-25: 5 mg via INTRAVENOUS
  Filled 2022-06-25: qty 5

## 2022-06-25 MED ORDER — METOPROLOL TARTRATE 5 MG/5ML IV SOLN
5.0000 mg | Freq: Once | INTRAVENOUS | Status: AC
  Administered 2022-06-25: 5 mg via INTRAVENOUS

## 2022-06-25 MED ORDER — METOPROLOL TARTRATE 100 MG PO TABS
100.0000 mg | ORAL_TABLET | Freq: Once | ORAL | Status: AC
  Administered 2022-06-25: 100 mg via ORAL

## 2022-06-25 MED ORDER — HYDRALAZINE HCL 20 MG/ML IJ SOLN
20.0000 mg | Freq: Once | INTRAMUSCULAR | Status: AC
  Administered 2022-06-25: 10 mg via INTRAVENOUS

## 2022-06-25 NOTE — Progress Notes (Signed)
Spoke to Dr. Milford Cage about CXR results, Dr. Milford Cage stated that he was going to ordera repeat chest X ray and to wait until that xray is complete and read before discharge

## 2022-06-25 NOTE — H&P (Signed)
Chief Complaint: [name] was seen in consultation today for suspected metastatic lung cancer  Referring Physician(s): Katragadda,Sreedhar  Supervising Physician: Roanna Banning  [name] Status: [facility] - Out-pt  History of Present Illness: [name] [name] is a 62 y.o. male with PMH significant for tobacco abuse being seen today in relation to newly discovered lung nodules concerning for lung cancer. [name] [name] presented to [facility] ED on 05/28/22 for back pain. Imaging at that time revealed bilateral pulmonary nodules, and follow-up PET scan on 3/21 showed hypermetabolic activity of numerous lung nodules, as well as multiple suspected bone metastases. [name] has been seen by Dr Ellin Saba from Oncology service and has been referred to IR for image-guided lung biopsy.   Past Medical History:  Diagnosis Date   Tobacco abuse     No past surgical history on file.  Allergies: [name] has no allergy information on record.  Medications: Prior to Admission medications   Medication Sig Start Date End Date Taking? Authorizing Provider  ibuprofen (ADVIL) 200 MG tablet Take 400 mg by mouth every 6 (six) hours as needed for moderate pain.    [provider]  tamsulosin (FLOMAX) 0.4 MG CAPS capsule Take 1 capsule (0.4 mg total) by mouth daily after supper. 06/08/22   Doreatha Massed, MD     No family history on file.  Social History   Socioeconomic History   Marital status: Married    Spouse name: Not on file   Number of children: Not on file   Years of education: Not on file   Highest education level: Not on file  Occupational History   Not on file  Tobacco Use   Smoking status: Every Day    Packs/day: 1    Types: Cigarettes   Smokeless tobacco: Not on file  Substance and Sexual Activity   Alcohol use: Not on file   Drug use: Not on file   Sexual activity: Not on file  Other Topics Concern   Not on file  Social History Narrative   Not on file   Social  Determinants of Health   Financial Resource Strain: Not on file  Food Insecurity: No Food Insecurity (06/08/2022)   Hunger Vital Sign    Worried About Running Out of Food in [name] Last Year: Never true    Ran Out of Food in [name] Last Year: Never true  Transportation Needs: No Transportation Needs (06/08/2022)   PRAPARE - Administrator, Civil Service (Medical): No    Lack of Transportation (Non-Medical): No  Physical Activity: Not on file  Stress: Not on file  Social Connections: Not on file    Code Status: Full Code  Review of Systems: A 12 point ROS discussed and pertinent positives are indicated in [name] HPI above.  All other systems are negative.  Review of Systems  Constitutional:  Negative for chills and fever.  Respiratory:  Negative for chest tightness and shortness of breath.   Cardiovascular:  Negative for chest pain and leg swelling.  Gastrointestinal:  Positive for abdominal pain. Negative for diarrhea, nausea and vomiting.  Musculoskeletal:  Positive for back pain.  Neurological:  Negative for dizziness and headaches.  Psychiatric/Behavioral:  Negative for confusion.     Vital Signs: BP (!) 186/86   Pulse 75   Temp (!) 97.1 F (36.2 C)   Resp 17   Ht  (1.676 m)   Wt 104 lb (47.2 kg)   SpO2 100%   BMI 16.79 kg/m  Physical Exam Vitals reviewed.  Constitutional:      General: [name] [name] is not in acute distress.    Appearance: [name] [name] is ill-appearing.  Cardiovascular:     Rate and Rhythm: Normal rate and regular rhythm.     Pulses: Normal pulses.     Heart sounds: Normal heart sounds.  Pulmonary:     Effort: Pulmonary effort is normal.     Breath sounds: Wheezing present.  Abdominal:     General: Bowel sounds are normal.     Palpations: Abdomen is soft.     Tenderness: There is abdominal tenderness.  Musculoskeletal:     Right lower leg: No edema.     Left lower leg: No edema.  Skin:    General: Skin is warm and dry.  Neurological:      Mental Status: [name] [name] is alert and oriented to person, place, and time.  Psychiatric:        Mood and Affect: Mood normal.        Behavior: Behavior normal.     Imaging: NM PET Image Initial (PI) Skull Base To Thigh  Addendum Date: 06/05/2022   ADDENDUM REPORT: 06/05/2022 14:16 ADDENDUM: [name] original report was by Dr. Gaylyn Rong. [name] following addendum is by Dr. Gaylyn Rong: Please note that activity in [name] right hand and wrist region does not have an accompanying CT abnormality and is thought to be injection related, although [name] FDG injection is documented as being in [name] LEFT hand. Given [name] normal and symmetric appearance in this region on [name] CT data I am highly skeptical of tumor involvement in [name] right hand and wrist, and would only suggest follow up imaging such as MRI if [name] [name] has specific symptoms related to [name] right hand/wrist. Electronically Signed   By: Gaylyn Rong M.D.   On: 06/05/2022 14:16   Result Date: 06/05/2022 CLINICAL DATA:  Initial treatment strategy for non-small cell lung cancer. EXAM: NUCLEAR MEDICINE PET SKULL BASE TO THIGH TECHNIQUE: 7.0 mCi F-18 FDG was injected intravenously. Full-ring PET imaging was performed from [name] skull base to thigh after [name] radiotracer. CT data was obtained and used for attenuation correction and anatomic localization. Fasting blood glucose: 82 mg/dl COMPARISON:  Multiple exams, including CT examinations from 05/28/2022 FINDINGS: Mediastinal blood pool activity: SUV max 2.6 Liver activity: SUV max NA NECK: No significant abnormal hypermetabolic activity in this region. Incidental CT findings: Periventricular white matter and corona radiata hypodensities favor chronic ischemic microvascular white matter disease. Bilateral common carotid atherosclerotic calcification. CHEST: Innumerable hypermetabolic bilateral pulmonary nodules with extensive right pleural nodularity with rind like and nodular pleural densities on [name] right  compatible with malignant pleural involvement right upper lobe medial paramediastinal nodule 2.7 by 1.8 cm with maximum SUV 13.8. Right lower lobe posteromedial pleural rind measures 6.8 by 1.4 cm on image 78 series 7 with maximum SUV 19.7. Index 1 cm left lower lobe pulmonary nodule on image 91 series 7 has maximum SUV of 6.5. Hypermetabolic right supraclavicular, paratracheal, right axillary, right hilar, right infrahilar, and right lower paraesophageal adenopathy noted. [name] index right paratracheal node measuring 1.2 cm in short axis on image 58 series 7 has maximum SUV of 17.0. Low-grade activity along [name] trace right pleural effusion which is probably malignant. Incidental CT findings: Atherosclerotic calcification of [name] thoracic aorta and branch vessels. Minimal atherosclerotic calcification of [name] left main coronary artery. ABDOMEN/PELVIS: No significant abnormal hypermetabolic activity in this region. Incidental CT findings: Atherosclerosis is present, including aortoiliac atherosclerotic disease. SKELETON:  Several hypermetabolic osseous metastatic lesions are observed. A destructive lesion of [name] right anterior fourth rib has maximum SUV of 13.7 cm. A mixed density lesion of [name] T11 vertebral body has a maximum SUV of 19.8. A mixed density but partially destructive lesion of [name] right L1 pedicle, transverse process, spinous process, and lamina has maximum SUV of 12.8. A mixed density but primarily lytic lesion of [name] left L2 vertebral body and pedicle has a maximum SUV of 15.2 with posterior cortical breakthrough and a suspected small amount of epidural tumor. Activity in [name] right hand and wrist is thought to be injection related. Incidental CT findings: Prior compressions at T9 and L2, [name] L2 compression of [name] superior endplate may be pathologic in nature. Old healed sternal body fracture. IMPRESSION: 1. Innumerable bilateral hypermetabolic pulmonary nodules and extensive right pleural  nodularity/pleural rind compatible with malignant pleural involvement. 2. Hypermetabolic right supraclavicular, right axillary, right hilar, right infrahilar, and right lower paraesophageal adenopathy. 3. Hypermetabolic osseous metastatic lesions involving [name] right anterior fourth rib, T11 vertebral body, L1 vertebral body, and L2 vertebral body. [name] L2 lesion has posterior cortical breakthrough and a suspected small amount of epidural tumor. 4. Trace right pleural effusion, probably malignant. 5. Periventricular white matter and corona radiata hypodensities favoring chronic ischemic microvascular white matter disease. 6. Atherosclerosis. 7. Old T9 and L2 compression fractures. Old healed sternal body fracture. Aortic Atherosclerosis (ICD10-I70.0). Electronically Signed: By: Gaylyn Rong M.D. On: 06/05/2022 11:57   MR Brain W Wo Contrast  Result Date: 06/05/2022 CLINICAL DATA:  Non-small cell lung cancer (NSCLC), staging EXAM: MRI HEAD WITHOUT AND WITH CONTRAST TECHNIQUE: Multiplanar, multiecho pulse sequences of [name] brain and surrounding structures were obtained without and with intravenous contrast. CONTRAST:  6.28mL GADAVIST GADOBUTROL 1 MMOL/ML IV SOLN COMPARISON:  CT head May 07, 2001. FINDINGS: Brain: No acute infarction, hemorrhage, hydrocephalus, extra-axial collection or mass lesion. Small remote lacunar infarcts in [name] right thalamus. Small remote perforator infarct in [name] left basal ganglia. Patchy T2/FLAIR hyperintensity within [name] white matter, compatible with chronic microvascular ischemic change. No pathologic enhancement. Vascular: Major arterial flow voids are maintained skull base. Skull and upper cervical spine: Normal marrow signal. Sinuses/Orbits: Negative. Other: Nonspecific cyst along [name] left nasal arch, likely benign. IMPRESSION: No evidence of acute intracranial abnormality or metastatic disease. Electronically Signed   By: Feliberto Harts M.D.   On: 06/05/2022 11:37   CT  Chest W Contrast  Result Date: 05/28/2022 CLINICAL DATA:  Abnormal lung nodule EXAM: CT CHEST WITH CONTRAST TECHNIQUE: Multidetector CT imaging of [name] chest was performed during intravenous contrast administration. RADIATION DOSE REDUCTION: This exam was performed according to [name] departmental dose-optimization program which includes automated exposure control, adjustment of the mA and/or kV according to [name] size and/or use of iterative reconstruction technique. CONTRAST:  75mL OMNIPAQUE IOHEXOL 300 MG/ML  SOLN COMPARISON:  CT renal stone 05/28/2022 chest x-ray report only 11/26/2009. FINDINGS: Cardiovascular: No significant vascular findings. Normal heart size. No pericardial effusion. There are atherosclerotic calcifications of [name] aorta. Mediastinum/Nodes: There is paratracheal lymphadenopathy measuring up to 12 mm short axis. There is subcarinal lymphadenopathy measuring up to 15 mm short axis. There is right hilar lymphadenopathy measuring up to 13 mm short axis. There is anterior mediastinal lymphadenopathy measuring up to 1 cm. [name] esophagus and visualized thyroid gland are within normal limits. Lungs/Pleura: There are numerous bilateral pulmonary nodules, right greater than left. Some nodules are adjacent and confluent peripherally. This is most significant in [name] subpleural region adjacent  to [name] posterior right hemithorax extending from [name] level of [name] aortic arch distally into [name] lung base best seen on coronal image 5/78. There is a small layering right pleural effusion. Largest focal nodule is seen in [name] right middle lobe measuring 4.4 by 2.7 cm. Largest left-sided pulmonary nodule is seen in [name] left upper lobe and is cavitary measuring 11 mm image 4/70. Other smaller left-sided pulmonary nodules are all solid. Upper Abdomen: No acute abnormality. Musculoskeletal: There is a lytic lesion in [name] right fourth rib with adjacent pleural thickening. There is a lytic lesion in [name] T11 vertebral body  with minimal pathologic fracture of [name] superior endplate. Chronic mild compression deformities of T3, T8 and T9. IMPRESSION: 1. Numerous bilateral pulmonary nodules, right greater than left, worrisome for metastatic disease. 2. There are more confluence larger masslike densities throughout [name] right lung peripherally which may represent primary neoplasm of [name] lung or pleura. 3. Small layering right pleural effusion. 4. Mediastinal and right hilar lymphadenopathy. 5. Lytic lesions in [name] right fourth rib and T11 vertebral body worrisome for metastatic disease. 6. Minimal pathologic fracture of [name] superior endplate of T11. Aortic Atherosclerosis (ICD10-I70.0). Electronically Signed   By: Darliss CheneyAmy  Guttmann M.D.   On: 05/28/2022 22:23   CT Renal [name] Study  Result Date: 05/28/2022 CLINICAL DATA:  Abdominal/flank pain, stone suspected EXAM: CT ABDOMEN AND PELVIS WITHOUT CONTRAST TECHNIQUE: Multidetector CT imaging of [name] abdomen and pelvis was performed following [name] standard protocol without IV contrast. RADIATION DOSE REDUCTION: This exam was performed according to [name] departmental dose-optimization program which includes automated exposure control, adjustment of the mA and/or kV according to [name] size and/or use of iterative reconstruction technique. COMPARISON:  None Available. FINDINGS: Lower chest: Multiple bilateral pulmonary nodules with as an example a 9 x 8 mm right middle lobe and 10 x 8 mm left lower lobe pulmonary nodules. Subpleural right nodular like densities of [name] as an example a 25 x 8 mm nodule (2:1). Trace right pleural effusion. Prominent but nonenlarged retrocrural lymph nodes. Hepatobiliary: No focal liver abnormality. No gallstones, gallbladder wall thickening, or pericholecystic fluid. No biliary dilatation. Pancreas: No focal lesion. Normal pancreatic contour. No surrounding inflammatory changes. No main pancreatic ductal dilatation. Spleen: Normal in size without focal abnormality.  Adrenals/Urinary Tract: No adrenal nodule bilaterally. No nephrolithiasis and no hydronephrosis. No definite contour-deforming renal mass. No ureterolithiasis or hydroureter. [name] urinary bladder is unremarkable. Stomach/Bowel: Stomach is within normal limits. No evidence of bowel wall thickening or dilatation. Scattered colonic diverticulosis. Appendix appears normal. Vascular/Lymphatic: No abdominal aorta or iliac aneurysm. Severe atherosclerotic plaque of [name] aorta and its branches. No abdominal, pelvic, or inguinal lymphadenopathy. Reproductive: Unremarkable prostate gland. Other: No intraperitoneal free fluid. No intraperitoneal free gas. No organized fluid collection. Musculoskeletal: No abdominal wall hernia or abnormality. Lytic lesion of [name] L2 vertebral body with associated pathologic compression fracture through [name] superior endplate leading to a at least 35% vertebral body height loss. Mixed sclerotic and lytic lesion of [name] L1 lamina and pedicle. IMPRESSION: 1. Findings concerning for metastatic malignancy. 2. Bilateral lung pulmonary nodules as well as right subpleural nodules concerning for metastases. Recommend CT chest with intravenous contrast for further evaluation. 3. Prominent but nonenlarged retrocrural lymph nodes. 4. Suggestion of osseous metastases with an L2 pathologic compression fracture with underlying lytic lesion. Mixed sclerotic and lytic lesion of [name] L1 lamina and pedicle. 5. Colonic diverticulosis with no acute diverticulitis. 6.  Aortic Atherosclerosis (ICD10-I70.0). Electronically Signed   By: Blanchie ServeMorgane  Tessie Fass M.D.   On: 05/28/2022 19:39    Labs:  CBC: Recent Labs    05/28/22 1821  WBC 12.3*  HGB 13.8  HCT 40.2  PLT 525*    COAGS: No results for input(s): "INR", "APTT" in [name] last 8760 hours.  BMP: Recent Labs    05/28/22 1821 06/08/22 0853  NA 123* 128*  K 3.6 4.4  CL 86* 93*  CO2 27 27  GLUCOSE 102* 95  BUN 5* 6  CALCIUM 8.5* 8.4*  CREATININE 0.73  0.75  GFRNONAA >60 >60    LIVER FUNCTION TESTS: No results for input(s): "BILITOT", "AST", "ALT", "ALKPHOS", "PROT", "ALBUMIN" in [name] last 8760 hours.  TUMOR MARKERS: No results for input(s): "AFPTM", "CEA", "CA199", "CHROMGRNA" in [name] last 8760 hours.  Assessment and Plan:  Aceton Kinnear is a 62 yo male being seen today in relation to suspected lung cancer. [name] was recently found to have numerous pulmonary nodules and bony metastases. Case has been reviewed and approved by Dr Deanne Coffer for right lung/pleural mass biopsy. Case is tentatively scheduled to [name] on 06/25/22 with Dr Milford Cage. [name] is NPO and in their usual state of health today.   Risks and benefits of CT guided lung nodule biopsy was discussed with [name] [name] including, but not limited to bleeding, hemoptysis, respiratory failure requiring intubation, infection, pneumothorax requiring chest tube placement, stroke from air embolism or even death.  All of [name] [name]'s questions were answered and [name] [name].  Consent signed and in chart.   Thank you for this interesting consult.  I greatly enjoyed meeting [name] [name] and look forward to participating in their care.  A copy of this report was sent to [name] requesting provider on this date.  Electronically Signed: Kennieth Francois, PA-C 06/25/2022, 8:05 AM   I spent a total of 30 Minutes   in face to face in clinical consultation, greater than 50% of which was counseling/coordinating care for suspected metastatic lung cancer.

## 2022-06-25 NOTE — Procedures (Signed)
Vascular and Interventional Radiology Procedure Note  Patient: [name] [name] DOB: 1960/04/22 Medical Record Number: [id number] Note Date/Time: 06/25/22 9:17 AM   Performing Physician: Roanna Banning, MD Assistant(s): None  Diagnosis: R lung masses, HM on PET   Procedure: RIGHT LUNG MASS BIOPSY  Anesthesia: Conscious Sedation Complications: None Estimated Blood Loss: Minimal Specimens: Sent for Pathology  Findings:  Successful CT-guided biopsy of R lung mass. A total of 3 samples were obtained. Hemostasis of the tract was achieved using Hemostatic Patch.  Plan: Bed rest for 1 hours. 1 Hr post Bx CXR.  See detailed procedure note with images in PACS. The patient tolerated the procedure well without incident or complication and was returned to Recovery in stable condition.    Roanna Banning, MD [facility] Radiology   Pager. [phone] Clinic. [phone]

## 2022-06-29 LAB — SURGICAL PATHOLOGY

## 2022-07-07 ENCOUNTER — Other Ambulatory Visit: Payer: Self-pay | Admitting: Hematology

## 2022-07-12 NOTE — Progress Notes (Signed)
[name] Stone [address], [place] [address]    Clinic Day:  07/13/2022  Referring physician: No ref. provider found  Patient Care Team: Patient, [name] Stone as PCP - General (General Practice) Doreatha Massed, MD as Medical Oncologist (Medical Oncology) Therese Sarah, RN as Oncology Nurse Navigator (Medical Oncology)   ASSESSMENT & PLAN:   Assessment: 1.  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  He was seen in the ER on 05/28/2022.  20 pound weight loss in 6 months.  [name] hemoptysis. - CT renal study (05/28/2022): Bilateral lung nodules, L2 pathologic compression fracture, L1 lamina and pedicle lesion. - CT chest (05/28/2022): Numerous bilateral lung nodules, right more than left.  Pleural thickening concerning for primary neoplasm of the lung or pleura.  Small layering right pleural effusion.  Mediastinal and right hilar lymphadenopathy.  Lytic lesions in the right fourth rib and T11 vertebral body. - MRI brain (06/02/2022): [name] metastatic disease. - PET scan (06/08/2022): Innumerable bilateral hypermetabolic lung nodules, extensive right pleural nodularity/pleural rind.  Hypermetabolic right supraclavicular, right axillary, right hilar, infrahilar lower paraesophageal adenopathy.  Hypermetabolic bone lesions-right anterior fourth rib, T11 vertebral body, L1 vertebral body, L2 vertebral body.  L2 lesion has posterior cortical breakthrough and suspected small amount of epidural tumor. - Right lung biopsy (06/25/2022): Invasive moderately differentiated adenocarcinoma, strong positive for TTF-1, negative for p40. - CARIS Assure: ARID1A, T p53 pathogenic variants.  Blood TMB: 18 mut/MB.  MSI-not detected.  Incidental germline variants and clonal hematopoiesis not detected. - XRT to the low back completed on 07/07/2022, 5 sessions.   2.  Social/family history: - He lives by  himself at home and is independent of ADLs and IADLs.  He is a retired Games developer.  Current active smoker, 1 pack/day for 40 years.  [name] exposure to asbestos. - Father died of lung cancer and was smoker.  Paternal uncle had cancer, type not known to the patient.    Plan: 1.  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 - We discussed prognosis of stage IV lung cancer with treatment intent being palliative. - We discussed palliative chemotherapy with carboplatin, pemetrexed and pembrolizumab, every 21 days for 4 cycles. - We have discussed the side effects in detail.  Literature was given about the medications. - We will ask interventional radiology for port placement. - He will be given B12 injection today and we will send a prescription for folic acid 1 tablet daily. - He will start treatment next week after port placement.  Will see him in 10 days after the treatment and the symptom management clinic.   2.  Low back pain: - XRT completed on 07/07/2022, 5 sessions.  Pain has improved. - He is taking Advil 400 mg every 6 hours.   3.  Difficulty urination: - Continue Flomax 0.4 mg daily which is helping.  4.  Weight loss: - He lost about 8 pounds in the last 4 weeks.  He reports decrease in appetite. - Will start him on Megace 400 mg twice daily.  Orders Placed This Encounter  Procedures   IR IMAGING GUIDED PORT INSERTION    Standing Status:   Future    Standing Expiration Date:   07/13/2023    Order Specific Question:   Reason for Exam (SYMPTOM  OR DIAGNOSIS REQUIRED)  Answer:   chemotherapy administration    Order Specific Question:   Preferred Imaging Location?    Answer:   [facility]   Magnesium    Standing Status:   Future    Standing Expiration Date:   07/21/2023   CBC with Differential    Standing Status:   Future    Standing Expiration Date:   07/21/2023   Comprehensive metabolic panel    Standing Status:   Future     Standing Expiration Date:   07/21/2023   T4    Standing Status:   Future    Standing Expiration Date:   07/21/2023   TSH    Standing Status:   Future    Standing Expiration Date:   07/21/2023   Magnesium    Standing Status:   Future    Standing Expiration Date:   08/11/2023   CBC with Differential    Standing Status:   Future    Standing Expiration Date:   08/11/2023   Comprehensive metabolic panel    Standing Status:   Future    Standing Expiration Date:   08/11/2023   Magnesium    Standing Status:   Future    Standing Expiration Date:   09/01/2023   CBC with Differential    Standing Status:   Future    Standing Expiration Date:   09/01/2023   Comprehensive metabolic panel    Standing Status:   Future    Standing Expiration Date:   09/01/2023   T4    Standing Status:   Future    Standing Expiration Date:   09/01/2023   TSH    Standing Status:   Future    Standing Expiration Date:   09/01/2023   Magnesium    Standing Status:   Future    Standing Expiration Date:   09/22/2023   CBC with Differential    Standing Status:   Future    Standing Expiration Date:   09/22/2023   Comprehensive metabolic panel    Standing Status:   Future    Standing Expiration Date:   09/22/2023      I,Katie Daubenspeck,acting as a scribe for Doreatha Massed, MD.,have documented all relevant documentation on the behalf of Doreatha Massed, MD,as directed by  Doreatha Massed, MD while in the presence of Doreatha Massed, MD.   I, Doreatha Massed MD, have reviewed the above documentation for accuracy and completeness, and I agree with the above.   Doreatha Massed, MD   4/29/20241:16 PM  CHIEF COMPLAINT:   Diagnosis: metastatic lung cancer to bones   Cancer Staging  Adenocarcinoma of right lung [facility]) Staging form: Lung, AJCC 8th Edition - Clinical stage from 07/13/2022: Stage IVB (cT4, cN3, cM1c) - Signed by Doreatha Massed, MD on 07/13/2022    Prior Therapy: none  Current  Therapy: Carboplatin, pemetrexed and pembrolizumab   HISTORY OF PRESENT ILLNESS:   Oncology History  Metastatic cancer (HCC)  06/07/2022 Initial Diagnosis   Metastatic cancer (HCC)   07/21/2022 -  Chemotherapy   Patient is on Treatment Plan : LUNG Carboplatin (5) + Pemetrexed (500) + Pembrolizumab (200) D1 q21d Induction x 4 cycles / Maintenance Pemetrexed (500) + Pembrolizumab (200) D1 q21d     Adenocarcinoma of right lung (HCC)  07/13/2022 Initial Diagnosis   Adenocarcinoma of right lung (HCC)   07/13/2022 Cancer Staging   Staging form: Lung, AJCC 8th Edition - Clinical stage from 07/13/2022: Stage IVB (cT4, cN3, cM1c) - Signed by Doreatha Massed, MD  on 07/13/2022 Histopathologic type: Adenocarcinoma, NOS Stage prefix: Initial diagnosis Histologic grade (G): G2 Histologic grading system: 4 grade system      INTERVAL HISTORY:   [name] Stone is a 62 y.o. male presenting to clinic today for follow up of metastatic lung cancer to bones. He was last seen by me on 06/08/22 in consultation.  Since his last visit, he met Dr. Langston Masker on 06/16/22 to discuss potential palliative radiation therapy to the spine for pain relief.  He also underwent right lung biopsy on 06/25/22. Pathology confirmed invasive moderately differentiated adenocarcinoma.  Today, he states that he is doing well overall. His appetite level is at 25%. His energy level is at 25%.  PAST MEDICAL HISTORY:   Past Medical History: Past Medical History:  Diagnosis Date   Tobacco abuse     Surgical History: [name] past surgical history on file.  Social History: Social History   Socioeconomic History   Marital status: Married    Spouse name: Not on file   Number of children: Not on file   Years of education: Not on file   Highest education level: Not on file  Occupational History   Not on file  Tobacco Use   Smoking status: Every Day    Packs/day: 1    Types: Cigarettes   Smokeless tobacco: Not on file  Substance and  Sexual Activity   Alcohol use: Not on file   Drug use: Not on file   Sexual activity: Not on file  Other Topics Concern   Not on file  Social History Narrative   Not on file   Social Determinants of Health   Financial Resource Strain: Not on file  Food Insecurity: [name] Food Insecurity (06/08/2022)   Hunger Vital Sign    Worried About Running Out of Food in the Last Year: Never true    Ran Out of Food in the Last Year: Never true  Transportation Needs: [name] Transportation Needs (06/08/2022)   PRAPARE - Administrator, Civil Service (Medical): [name]    Lack of Transportation (Non-Medical): [name]  Physical Activity: Not on file  Stress: Not on file  Social Connections: Not on file  Intimate Partner Violence: Not At Risk (06/08/2022)   Humiliation, Afraid, Rape, and Kick questionnaire    Fear of Current or Ex-Partner: [name]    Emotionally Abused: [name]    Physically Abused: [name]    Sexually Abused: [name]    Family History: [name] family history on file.  Current Medications:  Current Outpatient Medications:    folic acid (FOLVITE) 1 MG tablet, Take 1 tablet (1 mg total) by mouth daily., Disp: 90 tablet, Rfl: 3   ibuprofen (ADVIL) 200 MG tablet, Take 400 mg by mouth every 6 (six) hours as needed for moderate pain., Disp: , Rfl:    megestrol (MEGACE) 400 MG/10ML suspension, Take 10 mLs (400 mg total) by mouth 2 (two) times daily., Disp: 480 mL, Rfl: 3   tamsulosin (FLOMAX) 0.4 MG CAPS capsule, Take 1 capsule (0.4 mg total) by mouth daily after supper., Disp: 30 capsule, Rfl: 1   Allergies: Not on File  REVIEW OF SYSTEMS:   Review of Systems  Constitutional:  Positive for unexpected weight change. Negative for chills, fatigue and fever.  HENT:   Negative for lump/mass, mouth sores, nosebleeds, sore throat and trouble swallowing.   Eyes:  Negative for eye problems.  Respiratory:  Negative for cough and shortness of breath.   Cardiovascular:  Negative for chest pain, leg  swelling and  palpitations.  Gastrointestinal:  Negative for abdominal pain, constipation, diarrhea, nausea and vomiting.  Genitourinary:  Negative for bladder incontinence, difficulty urinating, dysuria, frequency, hematuria and nocturia.   Musculoskeletal:  Positive for back pain. Negative for arthralgias, flank pain, myalgias and neck pain.  Skin:  Negative for itching and rash.  Neurological:  Negative for dizziness, headaches and numbness.  Hematological:  Does not bruise/bleed easily.  Psychiatric/Behavioral:  Negative for depression, sleep disturbance and suicidal ideas. The patient is not nervous/anxious.   All other systems reviewed and are negative.    VITALS:   Blood pressure (!) 183/103, pulse (!) 102, temperature (!) 102.2 F (39 C), temperature source Tympanic, resp. rate 20, weight 101 lb 13.6 oz (46.2 kg), SpO2 99 %.  Wt Readings from Last 3 Encounters:  07/13/22 101 lb 13.6 oz (46.2 kg)  06/25/22 104 lb (47.2 kg)  06/08/22 109 lb (49.4 kg)    Body mass index is 16.44 kg/m.  Performance status (ECOG): 1 - Symptomatic but completely ambulatory  PHYSICAL EXAM:   Physical Exam Vitals and nursing note reviewed. Exam conducted with a chaperone present.  Constitutional:      Appearance: Normal appearance.  Cardiovascular:     Rate and Rhythm: Normal rate and regular rhythm.     Pulses: Normal pulses.     Heart sounds: Normal heart sounds.  Pulmonary:     Effort: Pulmonary effort is normal.     Breath sounds: Normal breath sounds.  Abdominal:     Palpations: Abdomen is soft. There is [name] hepatomegaly, splenomegaly or mass.     Tenderness: There is [name] abdominal tenderness.  Musculoskeletal:     Right lower leg: [name] edema.     Left lower leg: [name] edema.  Lymphadenopathy:     Cervical: [name] cervical adenopathy.     Right cervical: [name] superficial, deep or posterior cervical adenopathy.    Left cervical: [name] superficial, deep or posterior cervical adenopathy.     Upper Body:      Right upper body: [name] supraclavicular or axillary adenopathy.     Left upper body: [name] supraclavicular or axillary adenopathy.  Neurological:     General: [name] focal deficit present.     Mental Status: He is alert and oriented to person, place, and time.  Psychiatric:        Mood and Affect: Mood normal.        Behavior: Behavior normal.     LABS:      Latest Ref Rng & Units 06/25/2022    8:23 AM 05/28/2022    6:21 PM  CBC  WBC 4.0 - 10.5 K/uL 10.6  12.3   Hemoglobin 13.0 - 17.0 g/dL 16.1  09.6   Hematocrit 39.0 - 52.0 % 40.3  40.2   Platelets 150 - 400 K/uL 316  525       Latest Ref Rng & Units 06/08/2022    8:53 AM 05/28/2022    6:21 PM  CMP  Glucose 70 - 99 mg/dL 95  045   BUN 6 - 20 mg/dL 6  5   Creatinine 4.09 - 1.24 mg/dL 8.11  9.14   Sodium 782 - 145 mmol/L 128  123   Potassium 3.5 - 5.1 mmol/L 4.4  3.6   Chloride 98 - 111 mmol/L 93  86   CO2 22 - 32 mmol/L 27  27   Calcium 8.9 - 10.3 mg/dL 8.4  8.5      [name] results found for: "  CEA1", "CEA" / [name] results found for: "CEA1", "CEA" [name] results found for: "PSA1" [name] results found for: "ZOX096" [name] results found for: "CAN125"  [name] results found for: "TOTALPROTELP", "ALBUMINELP", "A1GS", "A2GS", "BETS", "BETA2SER", "GAMS", "MSPIKE", "SPEI" [name] results found for: "TIBC", "FERRITIN", "IRONPCTSAT" [name] results found for: "LDH"   STUDIES:   CT LUNG MASS BIOPSY  Result Date: 06/25/2022 INDICATION: Multifocal hypermetabolic RIGHT lung masses. EXAM: CT-GUIDED RIGHT LUNG MASS BIOPSY COMPARISON:  PET-CT, 06/04/2022.  CT chest, 05/28/2022. MEDICATIONS: Metoprolol 10 mg IV.  Hydralazine 10 mg IV.  Metoprolol 100 mg p.o. Medications administered for patient's hypertension. ANESTHESIA/SEDATION: Moderate (conscious) sedation was employed during this procedure. A total of Versed 0.5 mg and Fentanyl 25 mcg was administered intravenously. Moderate Sedation Time: 20 minutes. The patient's level of consciousness and vital signs were monitored  continuously by radiology nursing throughout the procedure under my direct supervision. CONTRAST:  None COMPLICATIONS: None immediate. PROCEDURE: RADIATION DOSE REDUCTION: This exam was performed according to the departmental dose-optimization program which includes automated exposure control, adjustment of the mA and/or kV according to patient size and/or use of iterative reconstruction technique. Informed consent was obtained from the patient following an explanation of the procedure, risks, benefits and alternatives. The patient understands,agrees and consents for the procedure. All questions were addressed. A time out was performed prior to the initiation of the procedure. The patient was positioned supine on the CT table and a limited chest CT was performed for procedural planning demonstrating multifocal RIGHT lung masses. The operative site was prepped and draped in the usual sterile fashion. Under sterile conditions and local anesthesia, a 17 gauge coaxial needle was advanced into the peripheral aspect of the nodule. Positioning was confirmed with intermittent CT fluoroscopy and followed by the acquisition of 3 core biopsies with an 18 gauge core needle biopsy device. The coaxial needle was removed following deployment of a hemostatic patch and superficial hemostasis was achieved with manual compression. Limited post procedural chest CT was negative for pneumothorax or additional complication. A dressing was placed. The patient tolerated the procedure well without immediate postprocedural complication. The patient was escorted to have an upright chest radiograph. IMPRESSION: Successful CT guided core needle core biopsy of RIGHT lung mass. Roanna Banning, MD [facility] Electronically Signed   By: Roanna Banning M.D.   On: 06/25/2022 16:24   DG Chest Port 1 View  Result Date: 06/25/2022 CLINICAL DATA:  1 hour follow-up post right lung biopsy. EXAM:  PORTABLE CHEST 1 VIEW COMPARISON:  Chest x-ray 06/25/2022, 2:10 p.m. FINDINGS: [name] definite pneumothorax visualized. Focal dense opacity in the lateral right lung is unchanged. Bilateral pulmonary nodules are unchanged. There is stable small right pleural effusion and right apical pleural thickening. Cardiomediastinal silhouette is unchanged. Osseous structures are stable. IMPRESSION: 1. [name] definite pneumothorax visualized. 2. Stable dense opacity in the lateral right lung. 3. Stable small right pleural effusion and pulmonary nodules. Electronically Signed   By: Darliss Cheney M.D.   On: 06/25/2022 15:43   DG Chest Port 1 View  Result Date: 06/25/2022 CLINICAL DATA:  Status post lung biopsy. EXAM: PORTABLE CHEST 1 VIEW COMPARISON:  Earlier today FINDINGS: There is a tiny pneumothorax identified overlying the periphery of the right upper lobe on the order of 4 mm. Again seen is the peripheral right mid lung mass and numerous bilateral pulmonary nodules. Stable cardiomediastinal contours. IMPRESSION: 1. Tiny right upper lobe pneumothorax post biopsy. 2. Similar appearance of right mid lung mass and  numerous bilateral pulmonary nodules. Electronically Signed   By: Signa Kell M.D.   On: 06/25/2022 14:35

## 2022-07-13 ENCOUNTER — Encounter: Payer: Self-pay | Admitting: Hematology

## 2022-07-13 ENCOUNTER — Inpatient Hospital Stay: Payer: Medicaid Other | Attending: Hematology | Admitting: Hematology

## 2022-07-13 ENCOUNTER — Inpatient Hospital Stay: Payer: Medicaid Other

## 2022-07-13 VITALS — BP 183/103 | HR 102 | Temp 102.2°F | Resp 20 | Wt 101.9 lb

## 2022-07-13 DIAGNOSIS — C3491 Malignant neoplasm of unspecified part of right bronchus or lung: Secondary | ICD-10-CM | POA: Diagnosis not present

## 2022-07-13 DIAGNOSIS — C799 Secondary malignant neoplasm of unspecified site: Secondary | ICD-10-CM | POA: Diagnosis not present

## 2022-07-13 MED ORDER — CYANOCOBALAMIN 1000 MCG/ML IJ SOLN
1000.0000 ug | Freq: Once | INTRAMUSCULAR | Status: AC
  Administered 2022-07-13: 1000 ug via INTRAMUSCULAR
  Filled 2022-07-13: qty 1

## 2022-07-13 MED ORDER — MEGESTROL ACETATE 400 MG/10ML PO SUSP: 400.0000 mg | Freq: Two times a day (BID) | ORAL | 3 refills | Status: AC

## 2022-07-13 MED ORDER — FOLIC ACID 1 MG PO TABS: 1.0000 mg | ORAL_TABLET | Freq: Every day | ORAL | 3 refills | Status: AC

## 2022-07-13 NOTE — Progress Notes (Signed)
START ON PATHWAY REGIMEN - Non-Small Cell Lung     A cycle is every 21 days:     Pembrolizumab      Pemetrexed      Carboplatin   **Always confirm dose/schedule in your pharmacy ordering system**  Patient Characteristics: Stage IV Metastatic, Nonsquamous, Molecular Analysis Completed, Molecular Alteration Present and Targeted Therapy Exhausted OR KRAS G12C+ or HER2+ Present and No Prior Chemo/Immunotherapy OR No Alteration Present, Initial Chemotherapy/Immunotherapy, PS =  0, 1, No Alteration Present, No Alteration Present, Candidate for Immunotherapy, PD-L1 Expression Positive 1-49% (TPS) / Negative / Not Tested / Awaiting Test Results and Immunotherapy Candidate Therapeutic Status: Stage IV Metastatic Histology: Nonsquamous Cell Broad Molecular Profiling Status: Molecular Analysis Completed Molecular Analysis Results: No Alteration Present ECOG Performance Status: 1 Chemotherapy/Immunotherapy Line of Therapy: Initial Chemotherapy/Immunotherapy EGFR Exons 18-21 Mutation Testing Status: Completed and Negative ALK Fusion/Rearrangement Testing Status: Completed and Negative BRAF V600 Mutation Testing Status: Completed and Negative KRAS G12C Mutation Testing Status: Completed and Negative MET Exon 14 Mutation Testing Status: Completed and Negative RET Fusion/Rearrangement Testing Status: Completed and Negative HER2 Mutation Testing Status: Completed and Negative NTRK Fusion/Rearrangement Testing Status: Completed and Negative ROS1 Fusion/Rearrangement Testing Status: Completed and Negative Immunotherapy Candidate Status: Candidate for Immunotherapy PD-L1 Expression Status: Awaiting Test Results Intent of Therapy: Non-Curative / Palliative Intent, Discussed with Patient

## 2022-07-13 NOTE — Patient Instructions (Signed)
[facility]  Discharge Instructions: Thank you for choosing [facility] to provide your oncology and hematology care.  If you have a lab appointment with the Cancer Center - please note that after April 8th, 2024, all labs will be drawn in the cancer center.  You do not have to check in or register with the main entrance as you have in the past but will complete your check-in in the cancer center.  Wear comfortable clothing and clothing appropriate for easy access to any Portacath or PICC line.   We strive to give you quality time with your provider. You may need to reschedule your appointment if you arrive late (15 or more minutes).  Arriving late affects you and other patients whose appointments are after yours.  Also, if you miss three or more appointments without notifying the office, you may be dismissed from the clinic at the provider's discretion.      For prescription refill requests, have your pharmacy contact our office and allow 72 hours for refills to be completed.    Today you received a B-12 injection.    To help prevent nausea and vomiting after your treatment, we encourage you to take your nausea medication as directed.  BELOW ARE SYMPTOMS THAT SHOULD BE REPORTED IMMEDIATELY: *FEVER GREATER THAN 100.4 F (38 C) OR HIGHER *CHILLS OR SWEATING *NAUSEA AND VOMITING THAT IS NOT CONTROLLED WITH YOUR NAUSEA MEDICATION *UNUSUAL SHORTNESS OF BREATH *UNUSUAL BRUISING OR BLEEDING *URINARY PROBLEMS (pain or burning when urinating, or frequent urination) *BOWEL PROBLEMS (unusual diarrhea, constipation, pain near the anus) TENDERNESS IN MOUTH AND THROAT WITH OR WITHOUT PRESENCE OF ULCERS (sore throat, sores in mouth, or a toothache) UNUSUAL RASH, SWELLING OR PAIN  UNUSUAL VAGINAL DISCHARGE OR ITCHING   Items with * indicate a potential emergency and should be followed up as soon as possible or go to the Emergency Department if any problems should  occur.  Please show the CHEMOTHERAPY ALERT CARD or IMMUNOTHERAPY ALERT CARD at check-in to the Emergency Department and triage nurse.  Should you have questions after your visit or need to cancel or reschedule your appointment, please contact [facility] [phone]  and follow the prompts.  Office hours are 8:00 a.m. to 4:30 p.m. Monday - Friday. Please note that voicemails left after 4:00 p.m. may not be returned until the following business day.  We are closed weekends and major holidays. You have access to a nurse at all times for urgent questions. Please call the main number to the clinic [phone] and follow the prompts.  For any non-urgent questions, you may also contact your provider using MyChart. We now offer e-Visits for anyone 18 and older to request care online for non-urgent symptoms. For details visit [email or web address].   Also download the MyChart app! Go to the app store, search "MyChart", open the app, select [facility], and log in with your MyChart username and password.   

## 2022-07-13 NOTE — Progress Notes (Signed)
Patient in clinic today for office visit with physician. Physician ordered B-12 injection. See MAR for injection information. Patient stable throughout injection. Patient discharged from clinic with daughter ambulatory and in stable condition.

## 2022-07-13 NOTE — Patient Instructions (Addendum)
[facility] at [facility] Discharge Instructions   You were seen and examined today by Dr. Ellin Saba.  He reviewed the results of your biopsy that is showing you have a type of lung cancer that is called adenocarcinoma. This cancer is also present in the lymph nodes and the bones. This makes this a Stage IV cancer, meaning it is not curable. We are able to treat this cancer - we can shrink it down, prevent it from spreading further, and alleviate the symptoms you are having related to this disease.   He discussed a treatment regimen with you that involves two chemotherapy drugs and an immunotherapy drug. The two chemo drugs are called pemetrexed (Alimta) and carboplatin. The immunotherapy drug is called Keytruda. You would come to the clinic once every 3 weeks to receive these drugs. After 4-6 cycles, the chemo drugs will fall off and you will receive only the immunotherapy once every 3 weeks. We will continue treatment as long as it is helping to control the cancer.   You will need a port placed in order for [place] to administer chemotherapy. We will arrange for you to have this placed with interventional radiology.   One of the chemo drugs you will get will require [place] to give you a B12 injection periodically. We will give this in the office today. You will also need to be on a folic acid tablet every day. We will send this to your pharmacy today.   We will also send a medication to help stimulate your appetite. It is called Megace. You will take two teaspoons of this twice a day.   Return as scheduled.      Thank you for choosing [facility] at [facility] to provide your oncology and hematology care.  To afford each patient quality time with our provider, please arrive at least 15 minutes before your scheduled appointment time.   If you have a lab appointment with the Cancer Center please come in thru the Main Entrance and check in at the main  information desk.  You need to re-schedule your appointment should you arrive 10 or more minutes late.  We strive to give you quality time with our providers, and arriving late affects you and other patients whose appointments are after yours.  Also, if you no show three or more times for appointments you may be dismissed from the clinic at the providers discretion.     Again, thank you for choosing [facility].  Our hope is that these requests will decrease the amount of time that you wait before being seen by our physicians.       _____________________________________________________________  Should you have questions after your visit to [facility], please contact our office at [phone] and follow the prompts.  Our office hours are 8:00 a.m. and 4:30 p.m. Monday - Friday.  Please note that voicemails left after 4:00 p.m. may not be returned until the following business day.  We are closed weekends and major holidays.  You do have access to a nurse 24-7, just call the main number to the clinic [phone] and do not press any options, hold on the line and a nurse will answer the phone.    For prescription refill requests, have your pharmacy contact our office and allow 72 hours.    Due to Covid, you will need to wear a mask upon entering the hospital. If you do not have  a mask, a mask will be given to you at the Main Entrance upon arrival. For doctor visits, patients may have 1 support person age 29 or older with them. For treatment visits, patients can not have anyone with them due to social distancing guidelines and our immunocompromised population.

## 2022-07-14 ENCOUNTER — Other Ambulatory Visit: Payer: Self-pay

## 2022-07-15 ENCOUNTER — Emergency Department (HOSPITAL_COMMUNITY): Payer: Medicaid Other

## 2022-07-15 ENCOUNTER — Inpatient Hospital Stay (HOSPITAL_COMMUNITY)
Admission: EM | Admit: 2022-07-15 | Discharge: 2022-07-23 | DRG: 308 | Disposition: A | Discharge status: Home / Self Care | Payer: Medicaid Other | Attending: Family Medicine | Admitting: Family Medicine

## 2022-07-15 ENCOUNTER — Other Ambulatory Visit: Payer: Self-pay

## 2022-07-15 ENCOUNTER — Encounter (HOSPITAL_COMMUNITY): Payer: Self-pay | Admitting: Emergency Medicine

## 2022-07-15 ENCOUNTER — Telehealth: Payer: Self-pay | Admitting: *Deleted

## 2022-07-15 ENCOUNTER — Other Ambulatory Visit: Payer: Self-pay | Admitting: Radiology

## 2022-07-15 DIAGNOSIS — C3490 Malignant neoplasm of unspecified part of unspecified bronchus or lung: Secondary | ICD-10-CM | POA: Diagnosis not present

## 2022-07-15 DIAGNOSIS — E872 Acidosis, unspecified: Secondary | ICD-10-CM | POA: Diagnosis present

## 2022-07-15 DIAGNOSIS — C3491 Malignant neoplasm of unspecified part of right bronchus or lung: Secondary | ICD-10-CM | POA: Diagnosis present

## 2022-07-15 DIAGNOSIS — Z515 Encounter for palliative care: Secondary | ICD-10-CM | POA: Diagnosis not present

## 2022-07-15 DIAGNOSIS — R54 Age-related physical debility: Secondary | ICD-10-CM | POA: Diagnosis present

## 2022-07-15 DIAGNOSIS — Z8249 Family history of ischemic heart disease and other diseases of the circulatory system: Secondary | ICD-10-CM | POA: Diagnosis not present

## 2022-07-15 DIAGNOSIS — J439 Emphysema, unspecified: Secondary | ICD-10-CM | POA: Diagnosis present

## 2022-07-15 DIAGNOSIS — F17210 Nicotine dependence, cigarettes, uncomplicated: Secondary | ICD-10-CM | POA: Diagnosis present

## 2022-07-15 DIAGNOSIS — Z66 Do not resuscitate: Secondary | ICD-10-CM | POA: Diagnosis present

## 2022-07-15 DIAGNOSIS — I429 Cardiomyopathy, unspecified: Secondary | ICD-10-CM | POA: Diagnosis present

## 2022-07-15 DIAGNOSIS — N5089 Other specified disorders of the male genital organs: Secondary | ICD-10-CM | POA: Diagnosis present

## 2022-07-15 DIAGNOSIS — E861 Hypovolemia: Secondary | ICD-10-CM | POA: Diagnosis present

## 2022-07-15 DIAGNOSIS — I959 Hypotension, unspecified: Secondary | ICD-10-CM | POA: Diagnosis present

## 2022-07-15 DIAGNOSIS — Z6820 Body mass index (BMI) 20.0-20.9, adult: Secondary | ICD-10-CM

## 2022-07-15 DIAGNOSIS — J440 Chronic obstructive pulmonary disease with acute lower respiratory infection: Secondary | ICD-10-CM | POA: Diagnosis present

## 2022-07-15 DIAGNOSIS — C7951 Secondary malignant neoplasm of bone: Secondary | ICD-10-CM | POA: Diagnosis present

## 2022-07-15 DIAGNOSIS — I5021 Acute systolic (congestive) heart failure: Secondary | ICD-10-CM | POA: Diagnosis present

## 2022-07-15 DIAGNOSIS — N179 Acute kidney failure, unspecified: Secondary | ICD-10-CM | POA: Diagnosis present

## 2022-07-15 DIAGNOSIS — E43 Unspecified severe protein-calorie malnutrition: Secondary | ICD-10-CM | POA: Insufficient documentation

## 2022-07-15 DIAGNOSIS — E876 Hypokalemia: Secondary | ICD-10-CM | POA: Diagnosis present

## 2022-07-15 DIAGNOSIS — R627 Adult failure to thrive: Secondary | ICD-10-CM | POA: Diagnosis present

## 2022-07-15 DIAGNOSIS — I471 Supraventricular tachycardia, unspecified: Secondary | ICD-10-CM | POA: Diagnosis present

## 2022-07-15 DIAGNOSIS — Z79899 Other long term (current) drug therapy: Secondary | ICD-10-CM | POA: Diagnosis not present

## 2022-07-15 DIAGNOSIS — R296 Repeated falls: Secondary | ICD-10-CM | POA: Diagnosis present

## 2022-07-15 DIAGNOSIS — E871 Hypo-osmolality and hyponatremia: Secondary | ICD-10-CM | POA: Diagnosis present

## 2022-07-15 DIAGNOSIS — C799 Secondary malignant neoplasm of unspecified site: Secondary | ICD-10-CM | POA: Diagnosis present

## 2022-07-15 DIAGNOSIS — J9601 Acute respiratory failure with hypoxia: Secondary | ICD-10-CM | POA: Diagnosis present

## 2022-07-15 DIAGNOSIS — R300 Dysuria: Secondary | ICD-10-CM | POA: Diagnosis present

## 2022-07-15 DIAGNOSIS — R64 Cachexia: Secondary | ICD-10-CM | POA: Diagnosis present

## 2022-07-15 DIAGNOSIS — Z7189 Other specified counseling: Secondary | ICD-10-CM | POA: Diagnosis not present

## 2022-07-15 DIAGNOSIS — J209 Acute bronchitis, unspecified: Secondary | ICD-10-CM | POA: Diagnosis present

## 2022-07-15 HISTORY — DX: Malignant (primary) neoplasm, unspecified: C80.1

## 2022-07-15 LAB — CBC
HCT: 34.4 % — ABNORMAL LOW (ref 39.0–52.0)
Hemoglobin: 11.7 g/dL — ABNORMAL LOW (ref 13.0–17.0)
MCH: 31.5 pg (ref 26.0–34.0)
MCHC: 34 g/dL (ref 30.0–36.0)
MCV: 92.5 fL (ref 80.0–100.0)
Platelets: 178 10*3/uL (ref 150–400)
RBC: 3.72 MIL/uL — ABNORMAL LOW (ref 4.22–5.81)
RDW: 14.8 % (ref 11.5–15.5)
WBC: 11.5 10*3/uL — ABNORMAL HIGH (ref 4.0–10.5)
nRBC: 0 % (ref 0.0–0.2)

## 2022-07-15 LAB — URINALYSIS, ROUTINE W REFLEX MICROSCOPIC
Bilirubin Urine: NEGATIVE
Glucose, UA: NEGATIVE mg/dL
Hgb urine dipstick: NEGATIVE
Ketones, ur: NEGATIVE mg/dL
Leukocytes,Ua: NEGATIVE
Nitrite: NEGATIVE
Protein, ur: 30 mg/dL — AB
Specific Gravity, Urine: >1.046 — ABNORMAL HIGH (ref 1.005–1.030)
pH: 5 (ref 5.0–8.0)

## 2022-07-15 LAB — MAGNESIUM: Magnesium: 1.7 mg/dL (ref 1.7–2.4)

## 2022-07-15 LAB — I-STAT CHEM 8, ED
BUN: 34 mg/dL — ABNORMAL HIGH (ref 6–20)
Calcium, Ion: 0.94 mmol/L — ABNORMAL LOW (ref 1.15–1.40)
Chloride: 90 mmol/L — ABNORMAL LOW (ref 98–111)
Creatinine, Ser: 1 mg/dL (ref 0.61–1.24)
Glucose, Bld: 126 mg/dL — ABNORMAL HIGH (ref 70–99)
HCT: 34 % — ABNORMAL LOW (ref 39.0–52.0)
Hemoglobin: 11.6 g/dL — ABNORMAL LOW (ref 13.0–17.0)
Potassium: 4.1 mmol/L (ref 3.5–5.1)
Sodium: 123 mmol/L — ABNORMAL LOW (ref 135–145)
TCO2: 24 mmol/L (ref 22–32)

## 2022-07-15 LAB — BASIC METABOLIC PANEL
Anion gap: 13 (ref 5–15)
BUN: 29 mg/dL — ABNORMAL HIGH (ref 6–20)
CO2: 20 mmol/L — ABNORMAL LOW (ref 22–32)
Calcium: 7.8 mg/dL — ABNORMAL LOW (ref 8.9–10.3)
Chloride: 87 mmol/L — ABNORMAL LOW (ref 98–111)
Creatinine, Ser: 1.23 mg/dL (ref 0.61–1.24)
GFR, Estimated: >60 mL/min (ref >60)
Glucose, Bld: 116 mg/dL — ABNORMAL HIGH (ref 70–99)
Potassium: 3.1 mmol/L — ABNORMAL LOW (ref 3.5–5.1)
Sodium: 120 mmol/L — ABNORMAL LOW (ref 135–145)

## 2022-07-15 LAB — TSH: TSH: 1.387 u[IU]/mL (ref 0.350–4.500)

## 2022-07-15 LAB — TROPONIN I (HIGH SENSITIVITY)
Troponin I (High Sensitivity): 54 ng/L — ABNORMAL HIGH (ref <18)
Troponin I (High Sensitivity): 54 ng/L — ABNORMAL HIGH (ref <18)

## 2022-07-15 LAB — POC OCCULT BLOOD, ED: Fecal Occult Blood: NEGATIVE

## 2022-07-15 LAB — D-DIMER, QUANTITATIVE: D-Dimer, Quant: 5.27 ug/mL-FEU — ABNORMAL HIGH (ref 0.00–0.50)

## 2022-07-15 LAB — LACTIC ACID, PLASMA
Lactic Acid, Venous: 4.2 mmol/L (ref 0.5–1.9)
Lactic Acid, Venous: 4.5 mmol/L (ref 0.5–1.9)
Lactic Acid, Venous: 2.6 mmol/L (ref 0.5–1.9)

## 2022-07-15 LAB — MRSA NEXT GEN BY PCR, NASAL: MRSA by PCR Next Gen: NOT DETECTED

## 2022-07-15 MED ORDER — MEGESTROL ACETATE 400 MG/10ML PO SUSP
400.0000 mg | Freq: Two times a day (BID) | ORAL | Status: DC
  Administered 2022-07-15 – 2022-07-23 (×13): 400 mg via ORAL
  Filled 2022-07-15 (×14): qty 10

## 2022-07-15 MED ORDER — HEPARIN SODIUM (PORCINE) 5000 UNIT/ML IJ SOLN
5000.0000 [IU] | Freq: Three times a day (TID) | INTRAMUSCULAR | Status: DC
  Administered 2022-07-15 – 2022-07-22 (×22): 5000 [IU] via SUBCUTANEOUS
  Filled 2022-07-15 (×22): qty 1

## 2022-07-15 MED ORDER — AMIODARONE HCL IN DEXTROSE 360-4.14 MG/200ML-% IV SOLN
60.0000 mg/h | INTRAVENOUS | Status: AC
  Administered 2022-07-15: 60 mg/h via INTRAVENOUS
  Filled 2022-07-15: qty 200

## 2022-07-15 MED ORDER — POTASSIUM CHLORIDE CRYS ER 20 MEQ PO TBCR
40.0000 meq | EXTENDED_RELEASE_TABLET | Freq: Once | ORAL | Status: AC
  Administered 2022-07-15: 40 meq via ORAL
  Filled 2022-07-15: qty 2

## 2022-07-15 MED ORDER — SODIUM CHLORIDE 0.9 % IV SOLN
500.0000 mg | INTRAVENOUS | Status: DC
  Administered 2022-07-15 – 2022-07-19 (×5): 500 mg via INTRAVENOUS
  Filled 2022-07-15 (×5): qty 5

## 2022-07-15 MED ORDER — AMIODARONE HCL IN DEXTROSE 360-4.14 MG/200ML-% IV SOLN
30.0000 mg/h | INTRAVENOUS | Status: AC
  Administered 2022-07-15 – 2022-07-16 (×2): 30 mg/h via INTRAVENOUS
  Filled 2022-07-15 (×2): qty 200

## 2022-07-15 MED ORDER — ACETAMINOPHEN 650 MG RE SUPP: 650.0000 mg | Freq: Four times a day (QID) | RECTAL | Status: DC | PRN

## 2022-07-15 MED ORDER — DIPHENHYDRAMINE HCL 50 MG/ML IJ SOLN
25.0000 mg | Freq: Once | INTRAMUSCULAR | Status: AC
  Administered 2022-07-15: 25 mg via INTRAVENOUS
  Filled 2022-07-15: qty 1

## 2022-07-15 MED ORDER — METHYLPREDNISOLONE SODIUM SUCC 125 MG IJ SOLR
60.0000 mg | Freq: Two times a day (BID) | INTRAMUSCULAR | Status: AC
  Administered 2022-07-16 (×2): 60 mg via INTRAVENOUS
  Filled 2022-07-15 (×2): qty 2

## 2022-07-15 MED ORDER — ONDANSETRON HCL 4 MG/2ML IJ SOLN
4.0000 mg | Freq: Four times a day (QID) | INTRAMUSCULAR | Status: DC | PRN
  Administered 2022-07-16 – 2022-07-18 (×4): 4 mg via INTRAVENOUS
  Filled 2022-07-15 (×4): qty 2

## 2022-07-15 MED ORDER — SODIUM CHLORIDE 0.9 % IV BOLUS
1000.0000 mL | Freq: Once | INTRAVENOUS | Status: AC
  Administered 2022-07-15: 1000 mL via INTRAVENOUS

## 2022-07-15 MED ORDER — IOHEXOL 350 MG/ML SOLN
100.0000 mL | Freq: Once | INTRAVENOUS | Status: AC | PRN
  Administered 2022-07-15: 100 mL via INTRAVENOUS

## 2022-07-15 MED ORDER — METHYLPREDNISOLONE SODIUM SUCC 125 MG IJ SOLR
80.0000 mg | Freq: Once | INTRAMUSCULAR | Status: AC
  Administered 2022-07-15: 80 mg via INTRAVENOUS
  Filled 2022-07-15: qty 2

## 2022-07-15 MED ORDER — AMIODARONE HCL 150 MG/3ML IV SOLN
INTRAVENOUS | Status: AC
  Filled 2022-07-15: qty 3

## 2022-07-15 MED ORDER — IPRATROPIUM-ALBUTEROL 0.5-2.5 (3) MG/3ML IN SOLN
3.0000 mL | RESPIRATORY_TRACT | Status: DC | PRN
  Administered 2022-07-21: 3 mL via RESPIRATORY_TRACT
  Filled 2022-07-15 (×2): qty 3

## 2022-07-15 MED ORDER — ONDANSETRON HCL 4 MG PO TABS: 4.0000 mg | ORAL_TABLET | Freq: Four times a day (QID) | ORAL | Status: DC | PRN

## 2022-07-15 MED ORDER — SODIUM CHLORIDE 0.9 % IV SOLN: INTRAVENOUS | Status: AC

## 2022-07-15 MED ORDER — GUAIFENESIN-DM 100-10 MG/5ML PO SYRP
15.0000 mL | ORAL_SOLUTION | Freq: Three times a day (TID) | ORAL | Status: AC
  Administered 2022-07-15 – 2022-07-16 (×3): 15 mL via ORAL
  Filled 2022-07-15 (×3): qty 15

## 2022-07-15 MED ORDER — ADENOSINE 6 MG/2ML IV SOLN
6.0000 mg | Freq: Once | INTRAVENOUS | Status: AC
  Administered 2022-07-15: 6 mg via INTRAVENOUS

## 2022-07-15 MED ORDER — POTASSIUM CHLORIDE 10 MEQ/100ML IV SOLN
10.0000 meq | Freq: Once | INTRAVENOUS | Status: AC
  Administered 2022-07-15: 10 meq via INTRAVENOUS
  Filled 2022-07-15: qty 100

## 2022-07-15 MED ORDER — MAGNESIUM SULFATE 2 GM/50ML IV SOLN
2.0000 g | Freq: Once | INTRAVENOUS | Status: AC
  Administered 2022-07-15: 2 g via INTRAVENOUS
  Filled 2022-07-15: qty 50

## 2022-07-15 MED ORDER — AMIODARONE HCL 150 MG/3ML IV SOLN
150.0000 mg | Freq: Once | INTRAVENOUS | Status: AC
  Administered 2022-07-15: 150 mg via INTRAVENOUS

## 2022-07-15 MED ORDER — ACETAMINOPHEN 325 MG PO TABS
650.0000 mg | ORAL_TABLET | Freq: Four times a day (QID) | ORAL | Status: DC | PRN
  Administered 2022-07-19: 650 mg via ORAL
  Filled 2022-07-15: qty 2

## 2022-07-15 MED ORDER — ADENOSINE 6 MG/2ML IV SOLN
12.0000 mg | Freq: Once | INTRAVENOUS | Status: AC
  Administered 2022-07-15: 12 mg via INTRAVENOUS

## 2022-07-15 MED ORDER — IPRATROPIUM-ALBUTEROL 0.5-2.5 (3) MG/3ML IN SOLN
3.0000 mL | Freq: Three times a day (TID) | RESPIRATORY_TRACT | Status: AC
  Administered 2022-07-15 – 2022-07-16 (×3): 3 mL via RESPIRATORY_TRACT
  Filled 2022-07-15 (×3): qty 3

## 2022-07-15 MED ORDER — POLYETHYLENE GLYCOL 3350 17 G PO PACK
17.0000 g | PACK | Freq: Every day | ORAL | Status: DC | PRN
  Administered 2022-07-21 – 2022-07-22 (×2): 17 g via ORAL
  Filled 2022-07-15 (×2): qty 1

## 2022-07-15 NOTE — Assessment & Plan Note (Signed)
Potassium 3.1.  From poor oral intake.  Magnesium 1.7. -Replete mag and K.

## 2022-07-15 NOTE — Assessment & Plan Note (Signed)
CR- 1.23.  Baseline about 0.7.  Likely due to poor oral intake, hypotension. - Hydrate.

## 2022-07-15 NOTE — Consult Note (Signed)
Cardiology Consultation   Patient ID: [name] Stone MRN: [id number]; DOB: 1961-02-10  Admit date: 07/15/2022 Date of Consult: 07/15/2022  PCP:  Patient, No Pcp Per   [facility] Providers Cardiologist:  New   Patient Profile:   [name] Stone is a 62 y.o. male with a hx of metastatic lung cancer  who is being seen 07/15/2022 for the evaluation of tachycardia  at the request of Dr Angelena Sole.  History of Present Illness:   Mr. 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 From onc note 07/13/22 treatment at this time is palliative. Presents with weakness and tachycardia. Reports generalized weakness over the last several weeks, poor appeite and weight loss. Today severe increase in his fatigue, falls at home. Called EMS, found to be in SVT in 220s and brought to ER   Na 120 K 3.1 Mg 1.7 WBC 11.5 Hgb 11.7 Plt 178 Ddimer 5.27 Trop 54  CXR pulm mets, COPD  EKG narrow complex regular tach 220 Subsequent EKG at low rates shows a long RP tachycardia consistent with atach Past Medical History:  Diagnosis Date   Cancer (HCC)    Tobacco abuse     No past surgical history on file.    Inpatient Medications: Scheduled Meds:  Continuous Infusions:  amiodarone 60 mg/hr (07/15/22 1522)   amiodarone     potassium chloride     PRN Meds:   Allergies:   Not on File  Social History:   Social History   Socioeconomic History   Marital status: Married    Spouse name: Not on file   Number of children: Not on file   Years of education: Not on file   Highest education level: Not on file  Occupational History   Not on file  Tobacco Use   Smoking status: Every Day    Packs/day: 1    Types: Cigarettes   Smokeless tobacco: Not on file  Substance and Sexual Activity   Alcohol use: Not on file   Drug use: Not on file   Sexual activity: Not on file  Other Topics Concern   Not on file  Social History Narrative   Not  on file   Social Determinants of Health   Financial Resource Strain: Not on file  Food Insecurity: No Food Insecurity (06/08/2022)   Hunger Vital Sign    Worried About Running Out of Food in the Last Year: Never true    Ran Out of Food in the Last Year: Never true  Transportation Needs: No Transportation Needs (06/08/2022)   PRAPARE - Administrator, Civil Service (Medical): No    Lack of Transportation (Non-Medical): No  Physical Activity: Not on file  Stress: Not on file  Social Connections: Not on file  Intimate Partner Violence: Not At Risk (06/08/2022)   Humiliation, Afraid, Rape, and Kick questionnaire    Fear of Current or Ex-Partner: No    Emotionally Abused: No    Physically Abused: No    Sexually Abused: No    Family History:   No family history on file.   ROS:  Please see the history of present illness.   All other ROS reviewed and negative.     Physical Exam/Data:   Vitals:   07/15/22 1555 07/15/22 1557 07/15/22 1605 07/15/22 1610  BP: 117/82   139/70  Pulse: (!) 110   (!) 183  Resp: 20   18  Temp:  TempSrc:      SpO2: 100%  100% 100%  Weight:  44.5 kg    Height:  5\' 6"  (1.676 m)     No intake or output data in the 24 hours ending 07/15/22 1614    07/15/2022    3:57 PM 07/15/2022    3:08 PM 07/13/2022   11:14 AM  Last 3 Weights  Weight (lbs) 98 lb 102 lb 8.2 oz 101 lb 13.6 oz  Weight (kg) 44.453 kg 46.5 kg 46.2 kg     Body mass index is 15.82 kg/m.  General:  Well nourished, well developed, in no acute distress HEENT: normal Neck: no JVD Vascular: No carotid bruits; Distal pulses 2+ bilaterally Cardiac: regular, tachy Lungs: bilateral wheezing Abd: soft, nontender, no hepatomegaly  Ext: no edema Musculoskeletal:  No deformities, BUE and BLE strength normal and equal Skin: warm and dry  Neuro:  CNs 2-12 intact, no focal abnormalities noted Psych:  Normal affect     Laboratory Data:  High Sensitivity Troponin:   Recent Labs   Lab 07/15/22 1506  TROPONINIHS 54*     Chemistry Recent Labs  Lab 07/15/22 1506 07/15/22 1521  NA 120* 123*  K 3.1* 4.1  CL 87* 90*  CO2 20*  --   GLUCOSE 116* 126*  BUN 29* 34*  CREATININE 1.23 1.00  CALCIUM 7.8*  --   MG 1.7  --   GFRNONAA >60  --   ANIONGAP 13  --     No results for input(s): "PROT", "ALBUMIN", "AST", "ALT", "ALKPHOS", "BILITOT" in the last 168 hours. Lipids No results for input(s): "CHOL", "TRIG", "HDL", "LABVLDL", "LDLCALC", "CHOLHDL" in the last 168 hours.  Hematology Recent Labs  Lab 07/15/22 1506 07/15/22 1521  WBC 11.5*  --   RBC 3.72*  --   HGB 11.7* 11.6*  HCT 34.4* 34.0*  MCV 92.5  --   MCH 31.5  --   MCHC 34.0  --   RDW 14.8  --   PLT 178  --    Thyroid No results for input(s): "TSH", "FREET4" in the last 168 hours.  BNPNo results for input(s): "BNP", "PROBNP" in the last 168 hours.  DDimer No results for input(s): "DDIMER" in the last 168 hours.   Radiology/Studies:  DG Chest Port 1 View  Result Date: 07/15/2022 CLINICAL DATA:  Weakness since Monday, SVT, cancer patient, smoker EXAM: PORTABLE CHEST 1 VIEW COMPARISON:  Portable exam 1527 hours compared to 06/25/2022 FINDINGS: External pacing leads. Normal heart size, mediastinal contours, and pulmonary vascularity. Emphysematous changes with multiple small nodular foci in both lungs consistent with pulmonary metastases. Confluence mass identified mid to lateral RIGHT lung. RIGHT apical pleuroparenchymal opacity unchanged. No new infiltrate or pneumothorax. Trace RIGHT pleural effusion. Bones demineralized. IMPRESSION: Pulmonary metastases greatest lateral mid RIGHT lung. Underlying COPD changes. Emphysema (ICD10-J43.9). Electronically Signed   By: Ulyses Southward M.D.   On: 07/15/2022 15:38     Assessment and Plan:   1.SVT - narrow complex regular tach 220 on presentation. Did not break with adenososine.   At slow rates its clear rhythm is  a long RP tachycardia consistent with atach -  K 3.1 (though normal by istat), Mg 1.7.  - soft bp's initially, patient already started on IV amiodarone by ER staff - continue IV amiodarone, can reload 150mg  if neccesary.overnight. Rhythm stabilizing on amio in ER though still variable.  - correct electrolytes.    2. Metastatic lung CA - per primary team  3. Hyponatremia -  per primary team   4. Elevated dimer - nonspecific findings given cancer history. With SOB, tachycardia, hypotension in cancer patient agree with CT PE already ordered by ER staff     For questions or updates, please contact [facility] Please consult [email or web address] for contact info under    Signed, Dina Rich, MD  07/15/2022 4:14 PM

## 2022-07-15 NOTE — Assessment & Plan Note (Addendum)
Heart rates up to 180s.  Temporary break with 6 mg and then 12 mg of adenosine, and was back in SVT.  150 mg amiodarone bolus given, currently on amiodarone drip.  Heart rate controlled in the 100s.  No cardiac history.  No chest pain.  No palpitation.  Reports some dizziness, dyspnea on exertion.  Hypokalemia 3.1.  Magnesium 1.7.  Was hypotensive to 70s, with resultant lactic acidosis. - Cardiology consulted, recommended continuing amiodarone drip, currently re-load 150 mg if necessary overnight. -Check TSH -Replete potassium. -Echocardiogram -Lactic acidosis 4.2 > 4.5, trend - 1 L bolus given, continue N/s 100cc/hr x 20hrs

## 2022-07-15 NOTE — Assessment & Plan Note (Addendum)
Sodium 120.  Chronic hyponatremia, over the past months sodium has been 1 23-1 28.  Likely from poor oral intake, occasional vomiting.] - 1 L bolus given, cont N/s 100cc/hr x 20hrs

## 2022-07-15 NOTE — H&P (Signed)
History and Physical    [name] Stone:[id number] DOB: [birth year] DOA: 07/15/2022  PCP: Patient, No Pcp Per   Patient coming from: Home  I have personally briefly reviewed patient's old medical records in [facility]  Chief Complaint: Weakness, Falls  HPI: [name] Stone is a 62 y.o. male with medical history significant for recent lung cancer diagnosis, tobacco abuse.  Patient was brought to the ED via EMS with reports of generalized weakness, 3 falls over the past 24 hours.  EMS found the patient to be in and out of SVT.  Patient denies palpitation, no chest pain.  Reports some dizziness when standing, and difficulty breathing with exertion.  No lower extremity swelling.  No fevers no chills.  He has chronic cough that has worsened over the past few days, with some rattling. He has had chronic poor oral intake, with weight loss. He was recently diagnosed with metastatic lung cancer, and has been evaluated by Dr. Ellin Saba, he has not started therapy.  ED Course: Temperature 98.6.  Tachycardic, heart rate recorded up to 180s, improved, now 100s with amiodarone.  Respiratory rate 16-28.  Blood pressure systolic down to 81/19.  D-dimer 5.27.  Lactic acidosis 4.2.  Troponin 54.  Head CT negative for acute abnormality.  CTA chest-negative for PE, small right pleural effusion, overall slight interval increase in size of bilateral pulmonary metastatic lesions compared to 05/18/2022. CT abdomen and pelvis W contrast- no acute abnormality.  Patient received 6 mg adenosine, breaking SVT for about a minute, then 12 mg of adenosine was given, with another break in SVT, patient was subsequently placed on amiodarone drip.  Cardiology was consulted.  Recommended continuing drip.  Review of Systems: As per HPI all other systems reviewed and negative.  Past Medical History:  Diagnosis Date   Cancer (HCC)    Tobacco abuse     No past surgical history on file.   reports that he has been smoking  cigarettes. He has been smoking an average of 1 pack per day. He does not have any smokeless tobacco history on file. No history on file for alcohol use and drug use.  Not on File  Family history of hypertension.  Prior to Admission medications   Medication Sig Start Date End Date Taking? Authorizing Provider  folic acid (FOLVITE) 1 MG tablet Take 1 tablet (1 mg total) by mouth daily. 07/13/22   Doreatha Massed, MD  ibuprofen (ADVIL) 200 MG tablet Take 400 mg by mouth every 6 (six) hours as needed for moderate pain.    [provider]  megestrol (MEGACE) 400 MG/10ML suspension Take 10 mLs (400 mg total) by mouth 2 (two) times daily. 07/13/22   Doreatha Massed, MD  tamsulosin (FLOMAX) 0.4 MG CAPS capsule Take 1 capsule (0.4 mg total) by mouth daily after supper. 06/08/22   Doreatha Massed, MD    Physical Exam: Vitals:   07/15/22 1610 07/15/22 1700 07/15/22 1705 07/15/22 1710  BP: 139/70 126/77 130/89 110/75  Pulse: (!) 183 (!) 103 (!) 103 100  Resp: 18 20 20 20   Temp:      TempSrc:      SpO2: 100% 100% 100% 100%  Weight:      Height:        Constitutional: Chronically ill-appearing, calm, comfortable Vitals:   07/15/22 1610 07/15/22 1700 07/15/22 1705 07/15/22 1710  BP: 139/70 126/77 130/89 110/75  Pulse: (!) 183 (!) 103 (!) 103 100  Resp: 18 20 20 20   Temp:  TempSrc:      SpO2: 100% 100% 100% 100%  Weight:      Height:       Eyes: PERRL, lids and conjunctivae normal ENMT: Mucous membranes are moist.  Neck: normal, supple, no masses, no thyromegaly Respiratory: Diffuse coarse expiratory rhonchi Cardiovascular: Tachycardic, regular rate and rhythm, no murmurs / rubs / gallops. No extremity edema.  Extremities warm. Abdomen: no tenderness, no masses palpated. No hepatosplenomegaly. Bowel sounds positive.  Musculoskeletal: no clubbing / cyanosis. No joint deformity upper and lower extremities.  Skin: no rashes, lesions, ulcers. No  induration Neurologic: Speech appears a bit slurred, but per daughter this is his baseline.  No apparent cranial nerve abnormality, moving extremities spontaneously. Psychiatric: Normal judgment and insight. Alert and oriented x 3. Normal mood.   Labs on Admission: I have personally reviewed following labs and imaging studies  CBC: Recent Labs  Lab 07/15/22 1506 07/15/22 1521  WBC 11.5*  --   HGB 11.7* 11.6*  HCT 34.4* 34.0*  MCV 92.5  --   PLT 178  --    Basic Metabolic Panel: Recent Labs  Lab 07/15/22 1506 07/15/22 1521  NA 120* 123*  K 3.1* 4.1  CL 87* 90*  CO2 20*  --   GLUCOSE 116* 126*  BUN 29* 34*  CREATININE 1.23 1.00  CALCIUM 7.8*  --   MG 1.7  --    Urine analysis:    Component Value Date/Time   COLORURINE YELLOW 05/28/2022 1601   APPEARANCEUR CLEAR 05/28/2022 1601   LABSPEC <1.005 (L) 05/28/2022 1601   PHURINE 5.5 05/28/2022 1601   GLUCOSEU NEGATIVE 05/28/2022 1601   HGBUR NEGATIVE 05/28/2022 1601   BILIRUBINUR NEGATIVE 05/28/2022 1601   KETONESUR NEGATIVE 05/28/2022 1601   PROTEINUR NEGATIVE 05/28/2022 1601   NITRITE NEGATIVE 05/28/2022 1601   LEUKOCYTESUR NEGATIVE 05/28/2022 1601    Radiological Exams on Admission: CT Angio Chest PE W and/or Wo Contrast  Result Date: 07/15/2022 CLINICAL DATA:  Abdominal trauma. Concern for pulmonary embolism. Lung cancer. EXAM: CT ANGIOGRAPHY CHEST CT ABDOMEN AND PELVIS WITH CONTRAST TECHNIQUE: Multidetector CT imaging of the chest was performed using the standard protocol during bolus administration of intravenous contrast. Multiplanar CT image reconstructions and MIPs were obtained to evaluate the vascular anatomy. Multidetector CT imaging of the abdomen and pelvis was performed using the standard protocol during bolus administration of intravenous contrast. RADIATION DOSE REDUCTION: This exam was performed according to the departmental dose-optimization program which includes automated exposure control, adjustment of  the mA and/or kV according to patient size and/or use of iterative reconstruction technique. CONTRAST:  OMNIPAQUE IOHEXOL 350 MG/ML SOLN COMPARISON:  Chest radiograph dated 07/15/2022. PET CT dated 06/04/2022.Chest CT dated 05/28/2022. FINDINGS: Evaluation of this exam is limited due to respiratory motion artifact. CTA CHEST FINDINGS Cardiovascular: There is no cardiomegaly or pericardial effusion. Mild atherosclerotic calcification of the thoracic aorta. No aneurysmal dilatation or dissection. No pulmonary artery embolus identified. Mediastinum/Nodes: Right hilar and subcarinal adenopathy relatively similar to the CT of 05/28/2022. The esophagus is grossly unremarkable. No mediastinal fluid collection. Lungs/Pleura: Overall slight interval increase in the size of bilateral pulmonary metastatic lesions seen space CT of 05/28/2022. For example a 1.5 cm nodule in the left lower lobe (104/8) previously measured approximately 1.0 cm. There is a small right pleural effusion. No pneumothorax. The central airways are patent. Musculoskeletal: Osteopenia with degenerative changes of the spine. Similar pattern osseous metastatic disease as CT of 05/28/2022. No acute osseous pathology. Loss of subcutaneous fat  and cachexia. Review of the MIP images confirms the above findings. CT ABDOMEN and PELVIS FINDINGS No intra-abdominal free air or free fluid. Hepatobiliary: The liver is unremarkable. No biliary dilatation. The gallbladder is unremarkable. Pancreas: No acute findings. Spleen: Normal in size without focal abnormality. Adrenals/Urinary Tract: The adrenal glands are unremarkable. Kidneys, visualized ureters, and urinary bladder appear unremarkable. Stomach/Bowel: There is no bowel obstruction or active inflammation. The appendix is normal. Vascular/Lymphatic: Mild aortoiliac atherosclerotic disease. The IVC is unremarkable. No portal venous gas. No adenopathy. Reproductive: The prostate and seminal vesicles are  grossly unremarkable. No pelvic mass. Other: None Musculoskeletal: Osteopenia with degenerative changes of the spine. Similar appearance of L2 sclerotic metastatic disease. No acute osseous pathology. Review of the MIP images confirms the above findings. IMPRESSION: 1. No acute intrathoracic, abdominal, or pelvic pathology. No pulmonary artery embolus identified. 2. Overall slight interval increase in the size of bilateral pulmonary metastatic lesions compared to the CT of 05/28/2022. 3. Small right pleural effusion. 4. Similar pattern of osseous metastatic disease as CT of 05/28/2022. 5. No definite evidence of metastasis in the abdomen and pelvis. 6.  Aortic Atherosclerosis (ICD10-I70.0). Electronically Signed   By: Elgie Collard M.D.   On: 07/15/2022 17:19   CT ABDOMEN PELVIS W CONTRAST  Result Date: 07/15/2022 CLINICAL DATA:  Abdominal trauma. Concern for pulmonary embolism. Lung cancer. EXAM: CT ANGIOGRAPHY CHEST CT ABDOMEN AND PELVIS WITH CONTRAST TECHNIQUE: Multidetector CT imaging of the chest was performed using the standard protocol during bolus administration of intravenous contrast. Multiplanar CT image reconstructions and MIPs were obtained to evaluate the vascular anatomy. Multidetector CT imaging of the abdomen and pelvis was performed using the standard protocol during bolus administration of intravenous contrast. RADIATION DOSE REDUCTION: This exam was performed according to the departmental dose-optimization program which includes automated exposure control, adjustment of the mA and/or kV according to patient size and/or use of iterative reconstruction technique. CONTRAST:  OMNIPAQUE IOHEXOL 350 MG/ML SOLN COMPARISON:  Chest radiograph dated 07/15/2022. PET CT dated 06/04/2022.Chest CT dated 05/28/2022. FINDINGS: Evaluation of this exam is limited due to respiratory motion artifact. CTA CHEST FINDINGS Cardiovascular: There is no cardiomegaly or pericardial effusion. Mild atherosclerotic  calcification of the thoracic aorta. No aneurysmal dilatation or dissection. No pulmonary artery embolus identified. Mediastinum/Nodes: Right hilar and subcarinal adenopathy relatively similar to the CT of 05/28/2022. The esophagus is grossly unremarkable. No mediastinal fluid collection. Lungs/Pleura: Overall slight interval increase in the size of bilateral pulmonary metastatic lesions seen space CT of 05/28/2022. For example a 1.5 cm nodule in the left lower lobe (104/8) previously measured approximately 1.0 cm. There is a small right pleural effusion. No pneumothorax. The central airways are patent. Musculoskeletal: Osteopenia with degenerative changes of the spine. Similar pattern osseous metastatic disease as CT of 05/28/2022. No acute osseous pathology. Loss of subcutaneous fat and cachexia. Review of the MIP images confirms the above findings. CT ABDOMEN and PELVIS FINDINGS No intra-abdominal free air or free fluid. Hepatobiliary: The liver is unremarkable. No biliary dilatation. The gallbladder is unremarkable. Pancreas: No acute findings. Spleen: Normal in size without focal abnormality. Adrenals/Urinary Tract: The adrenal glands are unremarkable. Kidneys, visualized ureters, and urinary bladder appear unremarkable. Stomach/Bowel: There is no bowel obstruction or active inflammation. The appendix is normal. Vascular/Lymphatic: Mild aortoiliac atherosclerotic disease. The IVC is unremarkable. No portal venous gas. No adenopathy. Reproductive: The prostate and seminal vesicles are grossly unremarkable. No pelvic mass. Other: None Musculoskeletal: Osteopenia with degenerative changes of the spine. Similar appearance  of L2 sclerotic metastatic disease. No acute osseous pathology. Review of the MIP images confirms the above findings. IMPRESSION: 1. No acute intrathoracic, abdominal, or pelvic pathology. No pulmonary artery embolus identified. 2. Overall slight interval increase in the size of bilateral  pulmonary metastatic lesions compared to the CT of 05/28/2022. 3. Small right pleural effusion. 4. Similar pattern of osseous metastatic disease as CT of 05/28/2022. 5. No definite evidence of metastasis in the abdomen and pelvis. 6.  Aortic Atherosclerosis (ICD10-I70.0). Electronically Signed   By: Elgie Collard M.D.   On: 07/15/2022 17:19   CT Head Wo Contrast  Result Date: 07/15/2022 CLINICAL DATA:  Weakness and tachycardia. 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 EXAM: CT HEAD WITHOUT CONTRAST TECHNIQUE: Contiguous axial images were obtained from the base of the skull through the vertex without intravenous contrast. RADIATION DOSE REDUCTION: This exam was performed according to the departmental dose-optimization program which includes automated exposure control, adjustment of the mA and/or kV according to patient size and/or use of iterative reconstruction technique. COMPARISON:  MRI head 06/02/2022 and CT head 05/07/2001 FINDINGS: Brain: No intracranial hemorrhage, mass effect, or evidence of acute infarct. No hydrocephalus. No extra-axial fluid collection. Generalized cerebral atrophy. Ill-defined hypoattenuation within the cerebral white matter is nonspecific but consistent with chronic small vessel ischemic disease. Chronic right thalamic infarcts. Vascular: No hyperdense vessel. Intracranial arterial calcification. Skull: No fracture or focal lesion. Sinuses/Orbits: No acute finding. Paranasal sinuses and mastoid air cells are well aerated. Other: None. IMPRESSION: 1. No evidence of acute intracranial abnormality. Electronically Signed   By: Minerva Fester M.D.   On: 07/15/2022 17:05   DG Chest Port 1 View  Result Date: 07/15/2022 CLINICAL DATA:  Weakness since Monday, SVT, cancer patient, smoker EXAM: PORTABLE CHEST 1 VIEW COMPARISON:  Portable exam 1527 hours compared to 06/25/2022 FINDINGS: External pacing leads. Normal heart size, mediastinal contours, and pulmonary vascularity.  Emphysematous changes with multiple small nodular foci in both lungs consistent with pulmonary metastases. Confluence mass identified mid to lateral RIGHT lung. RIGHT apical pleuroparenchymal opacity unchanged. No new infiltrate or pneumothorax. Trace RIGHT pleural effusion. Bones demineralized. IMPRESSION: Pulmonary metastases greatest lateral mid RIGHT lung. Underlying COPD changes. Emphysema (ICD10-J43.9). Electronically Signed   By: Ulyses Southward M.D.   On: 07/15/2022 15:38    EKG: Independently reviewed.  SVT, rate 146, QTc 401.  Assessment/Plan Principal Problem:   SVT (supraventricular tachycardia) Active Problems:   Hyponatremia   AKI (acute kidney injury) (HCC)   Acute bronchitis   Metastatic cancer (HCC)   Hypokalemia   Assessment and Plan: * SVT (supraventricular tachycardia) Heart rates up to 180s.  Temporary break with 6 mg and then 12 mg of adenosine, and was back in SVT.  150 mg amiodarone bolus given, currently on amiodarone drip.  Heart rate controlled in the 100s.  No cardiac history.  No chest pain.  No palpitation.  Reports some dizziness, dyspnea on exertion.  Hypokalemia 3.1.  Magnesium 1.7.  Was hypotensive to 70s, with resultant lactic acidosis. - Cardiology consulted, recommended continuing amiodarone drip, currently re-load 150 mg if necessary overnight. -Check TSH -Replete potassium. -Echocardiogram -Lactic acidosis 4.2 > 4.5, trend - 1 L bolus given, continue N/s 100cc/hr x 20hrs  Acute bronchitis Dyspnea on exertion, diffuse rhonchi, worsening cough.  Denies diagnosis of COPD.  Ongoing tobacco abuse 1 pack/day.  CTA chest negative for acute abnormality.  O2 sats -DuoNebs as needed and scheduled -Mucolytics as needed -Azithromycin -Solu-Medrol 80mg  x 1, continue 60 twice  daily  AKI (acute kidney injury) (HCC) CR- 1.23.  Baseline about 0.7.  Likely due to poor oral intake, hypotension. - Hydrate.  Hyponatremia Sodium 120.  Chronic hyponatremia, over the  past months sodium has been 1 23-1 28.  Likely from poor oral intake, occasional vomiting.] - 1 L bolus given, cont N/s 100cc/hr x 20hrs  Hypokalemia Potassium 3.1.  From poor oral intake.  Magnesium 1.7. -Replete mag and K.  Metastatic cancer (HCC) Following with Dr. Ellin Saba.  New diagnosis of non-small cell lung cancer.  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  CT chest 05/28/22 showed numerous bilateral lung nodules.  Right brain no metastatic disease, PET scan 3/25 - Innumerable bilateral hypermetabolic lung nodules, lymphadenopathy.   - He is to start chemotherapy next week after port placement by IR.  Intent of treatment being palliative. -Resume Marinol  Lactic acidosis Likely due to hypotension in the setting of SVT.  CTA chest, CT abdomen and pelvis negative for infectious etiology.  He reports intermittent dysuria.  WBC 11.5. -Check UA   DVT prophylaxis: heparin Code Status: FULL code- confirmed with patient and daughter at bedside. Family Communication: Daughter [place] at bedside Disposition Plan:  > 2 days Consults called: Cardiology Admission status: Inpt Step down I certify that at the point of admission it is my clinical judgment that the patient will require inpatient hospital care spanning beyond 2 midnights from the point of admission due to high intensity of service, high risk for further deterioration and high frequency of surveillance required.  Author: Onnie Boer, MD 07/15/2022 7:03 PM  For on call review [email or web address].

## 2022-07-15 NOTE — Telephone Encounter (Signed)
Received call from daughter [place] to advise that patient had fallen 2 times over the last 24 hours and he could not recall as to if he had lost consciousness.  In addition, is very weak and is having difficult time walking.  Advised to call 911.

## 2022-07-15 NOTE — Assessment & Plan Note (Signed)
Likely due to hypotension in the setting of SVT.  CTA chest, CT abdomen and pelvis negative for infectious etiology.  He reports intermittent dysuria.  WBC 11.5. -Check UA

## 2022-07-15 NOTE — Assessment & Plan Note (Addendum)
Following with Dr. Ellin Saba.  New diagnosis of non-small cell lung cancer.  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  CT chest 05/28/22 showed numerous bilateral lung nodules.  Right brain no metastatic disease, PET scan 3/25 - Innumerable bilateral hypermetabolic lung nodules, lymphadenopathy.   - He is to start chemotherapy next week after port placement by IR.  Intent of treatment being palliative. -Resume Marinol

## 2022-07-15 NOTE — ED Triage Notes (Signed)
Ems called out for weakness since Monday. When ems arrived pt was going in and out svt.

## 2022-07-15 NOTE — ED Provider Notes (Addendum)
[place] EMERGENCY DEPARTMENT AT [facility] Provider Note   CSN: [id number] Arrival date & time: 07/15/22  1500     History  Chief Complaint  Patient presents with   Weakness    [name] Stone is a 62 y.o. male.  Pt is a 62 yo male with pmhx significant for recently diagnosed metastatic lung cancer.  Pt presents to the ED today with weakness.  Pt has had multiple falls.  Pt saw Dr. Ellin Saba on 4/29 for the first time.  He has not had any treatment yet for his cancer.  EMS was called today for weakness.  They found pt to be in SVT.  Initial bp in the 70s, but now in the low 100s.  Pt does not feel the palpitations.         Home Medications Prior to Admission medications   Medication Sig Start Date End Date Taking? Authorizing Provider  folic acid (FOLVITE) 1 MG tablet Take 1 tablet (1 mg total) by mouth daily. 07/13/22   Doreatha Massed, MD  ibuprofen (ADVIL) 200 MG tablet Take 400 mg by mouth every 6 (six) hours as needed for moderate pain.    [provider]  megestrol (MEGACE) 400 MG/10ML suspension Take 10 mLs (400 mg total) by mouth 2 (two) times daily. 07/13/22   Doreatha Massed, MD  tamsulosin (FLOMAX) 0.4 MG CAPS capsule Take 1 capsule (0.4 mg total) by mouth daily after supper. 06/08/22   Doreatha Massed, MD      Allergies    Patient has no allergy information on record.    Review of Systems   Review of Systems  Cardiovascular:  Positive for palpitations.  All other systems reviewed and are negative.   Physical Exam Updated Vital Signs BP 110/75   Pulse 100   Temp 98.6 F (37 C) (Oral)   Resp 20   Ht 5\' 6"  (1.676 m)   Wt 44.5 kg   SpO2 100%   BMI 15.82 kg/m  Physical Exam Vitals and nursing note reviewed. Exam conducted with a chaperone present.  Constitutional:      General: He is in acute distress.     Appearance: Normal appearance. He is cachectic. He is ill-appearing.  HENT:     Head: Normocephalic and  atraumatic.     Right Ear: External ear normal.     Left Ear: External ear normal.     Nose: Nose normal.     Mouth/Throat:     Mouth: Mucous membranes are dry.  Eyes:     Extraocular Movements: Extraocular movements intact.     Conjunctiva/sclera: Conjunctivae normal.     Pupils: Pupils are equal, round, and reactive to light.  Cardiovascular:     Rate and Rhythm: Regular rhythm. Tachycardia present.     Pulses: Normal pulses.     Heart sounds: Normal heart sounds.  Pulmonary:     Effort: Pulmonary effort is normal.     Breath sounds: Normal breath sounds.  Abdominal:     General: Abdomen is flat. Bowel sounds are normal.     Palpations: Abdomen is soft.  Genitourinary:    Rectum: Guaiac result negative.  Musculoskeletal:     Cervical back: Normal range of motion and neck supple.     Comments: Bruising/abrasions to back  Skin:    General: Skin is warm.     Capillary Refill: Capillary refill takes less than 2 seconds.  Neurological:     General: No focal deficit present.  Mental Status: He is alert and oriented to person, place, and time.  Psychiatric:        Mood and Affect: Mood normal.        Behavior: Behavior normal.     ED Results / Procedures / Treatments   Labs (all labs ordered are listed, but only abnormal results are displayed) Labs Reviewed  BASIC METABOLIC PANEL - Abnormal; Notable for the following components:      Result Value   Sodium 120 (*)    Potassium 3.1 (*)    Chloride 87 (*)    CO2 20 (*)    Glucose, Bld 116 (*)    BUN 29 (*)    Calcium 7.8 (*)    All other components within normal limits  CBC - Abnormal; Notable for the following components:   WBC 11.5 (*)    RBC 3.72 (*)    Hemoglobin 11.7 (*)    HCT 34.4 (*)    All other components within normal limits  LACTIC ACID, PLASMA - Abnormal; Notable for the following components:   Lactic Acid, Venous 4.2 (*)    All other components within normal limits  D-DIMER, QUANTITATIVE (NOT AT  [facility]) - Abnormal; Notable for the following components:   D-Dimer, Quant 5.27 (*)    All other components within normal limits  I-STAT CHEM 8, ED - Abnormal; Notable for the following components:   Sodium 123 (*)    Chloride 90 (*)    BUN 34 (*)    Glucose, Bld 126 (*)    Calcium, Ion 0.94 (*)    Hemoglobin 11.6 (*)    HCT 34.0 (*)    All other components within normal limits  TROPONIN I (HIGH SENSITIVITY) - Abnormal; Notable for the following components:   Troponin I (High Sensitivity) 54 (*)    All other components within normal limits  MAGNESIUM  LACTIC ACID, PLASMA  POC OCCULT BLOOD, ED  TROPONIN I (HIGH SENSITIVITY)    EKG EKG Interpretation  Date/Time:  Wednesday Jul 15 2022 15:11:00 EDT Ventricular Rate:  146 PR Interval:  88 QRS Duration: 85 QT Interval:  257 QTC Calculation: 401 R Axis:   78 Text Interpretation: Sinus tachycardia Multiple ventricular premature complexes Repol abnrm suggests ischemia, diffuse leads st vs atrial tachy Confirmed by Jacalyn Lefevre [phone]) on 07/15/2022 6:03:24 PM  Radiology CT Angio Chest PE W and/or Wo Contrast  Result Date: 07/15/2022 CLINICAL DATA:  Abdominal trauma. Concern for pulmonary embolism. Lung cancer. EXAM: CT ANGIOGRAPHY CHEST CT ABDOMEN AND PELVIS WITH CONTRAST TECHNIQUE: Multidetector CT imaging of the chest was performed using the standard protocol during bolus administration of intravenous contrast. Multiplanar CT image reconstructions and MIPs were obtained to evaluate the vascular anatomy. Multidetector CT imaging of the abdomen and pelvis was performed using the standard protocol during bolus administration of intravenous contrast. RADIATION DOSE REDUCTION: This exam was performed according to the departmental dose-optimization program which includes automated exposure control, adjustment of the mA and/or kV according to patient size and/or use of iterative reconstruction technique. CONTRAST:  OMNIPAQUE IOHEXOL 350  MG/ML SOLN COMPARISON:  Chest radiograph dated 07/15/2022. PET CT dated 06/04/2022.Chest CT dated 05/28/2022. FINDINGS: Evaluation of this exam is limited due to respiratory motion artifact. CTA CHEST FINDINGS Cardiovascular: There is no cardiomegaly or pericardial effusion. Mild atherosclerotic calcification of the thoracic aorta. No aneurysmal dilatation or dissection. No pulmonary artery embolus identified. Mediastinum/Nodes: Right hilar and subcarinal adenopathy relatively similar to the CT of 05/28/2022. The esophagus  is grossly unremarkable. No mediastinal fluid collection. Lungs/Pleura: Overall slight interval increase in the size of bilateral pulmonary metastatic lesions seen space CT of 05/28/2022. For example a 1.5 cm nodule in the left lower lobe (104/8) previously measured approximately 1.0 cm. There is a small right pleural effusion. No pneumothorax. The central airways are patent. Musculoskeletal: Osteopenia with degenerative changes of the spine. Similar pattern osseous metastatic disease as CT of 05/28/2022. No acute osseous pathology. Loss of subcutaneous fat and cachexia. Review of the MIP images confirms the above findings. CT ABDOMEN and PELVIS FINDINGS No intra-abdominal free air or free fluid. Hepatobiliary: The liver is unremarkable. No biliary dilatation. The gallbladder is unremarkable. Pancreas: No acute findings. Spleen: Normal in size without focal abnormality. Adrenals/Urinary Tract: The adrenal glands are unremarkable. Kidneys, visualized ureters, and urinary bladder appear unremarkable. Stomach/Bowel: There is no bowel obstruction or active inflammation. The appendix is normal. Vascular/Lymphatic: Mild aortoiliac atherosclerotic disease. The IVC is unremarkable. No portal venous gas. No adenopathy. Reproductive: The prostate and seminal vesicles are grossly unremarkable. No pelvic mass. Other: None Musculoskeletal: Osteopenia with degenerative changes of the spine. Similar appearance  of L2 sclerotic metastatic disease. No acute osseous pathology. Review of the MIP images confirms the above findings. IMPRESSION: 1. No acute intrathoracic, abdominal, or pelvic pathology. No pulmonary artery embolus identified. 2. Overall slight interval increase in the size of bilateral pulmonary metastatic lesions compared to the CT of 05/28/2022. 3. Small right pleural effusion. 4. Similar pattern of osseous metastatic disease as CT of 05/28/2022. 5. No definite evidence of metastasis in the abdomen and pelvis. 6.  Aortic Atherosclerosis (ICD10-I70.0). Electronically Signed   By: Elgie Collard M.D.   On: 07/15/2022 17:19   CT ABDOMEN PELVIS W CONTRAST  Result Date: 07/15/2022 CLINICAL DATA:  Abdominal trauma. Concern for pulmonary embolism. Lung cancer. EXAM: CT ANGIOGRAPHY CHEST CT ABDOMEN AND PELVIS WITH CONTRAST TECHNIQUE: Multidetector CT imaging of the chest was performed using the standard protocol during bolus administration of intravenous contrast. Multiplanar CT image reconstructions and MIPs were obtained to evaluate the vascular anatomy. Multidetector CT imaging of the abdomen and pelvis was performed using the standard protocol during bolus administration of intravenous contrast. RADIATION DOSE REDUCTION: This exam was performed according to the departmental dose-optimization program which includes automated exposure control, adjustment of the mA and/or kV according to patient size and/or use of iterative reconstruction technique. CONTRAST:  OMNIPAQUE IOHEXOL 350 MG/ML SOLN COMPARISON:  Chest radiograph dated 07/15/2022. PET CT dated 06/04/2022.Chest CT dated 05/28/2022. FINDINGS: Evaluation of this exam is limited due to respiratory motion artifact. CTA CHEST FINDINGS Cardiovascular: There is no cardiomegaly or pericardial effusion. Mild atherosclerotic calcification of the thoracic aorta. No aneurysmal dilatation or dissection. No pulmonary artery embolus identified. Mediastinum/Nodes:  Right hilar and subcarinal adenopathy relatively similar to the CT of 05/28/2022. The esophagus is grossly unremarkable. No mediastinal fluid collection. Lungs/Pleura: Overall slight interval increase in the size of bilateral pulmonary metastatic lesions seen space CT of 05/28/2022. For example a 1.5 cm nodule in the left lower lobe (104/8) previously measured approximately 1.0 cm. There is a small right pleural effusion. No pneumothorax. The central airways are patent. Musculoskeletal: Osteopenia with degenerative changes of the spine. Similar pattern osseous metastatic disease as CT of 05/28/2022. No acute osseous pathology. Loss of subcutaneous fat and cachexia. Review of the MIP images confirms the above findings. CT ABDOMEN and PELVIS FINDINGS No intra-abdominal free air or free fluid. Hepatobiliary: The liver is unremarkable. No biliary dilatation. The  gallbladder is unremarkable. Pancreas: No acute findings. Spleen: Normal in size without focal abnormality. Adrenals/Urinary Tract: The adrenal glands are unremarkable. Kidneys, visualized ureters, and urinary bladder appear unremarkable. Stomach/Bowel: There is no bowel obstruction or active inflammation. The appendix is normal. Vascular/Lymphatic: Mild aortoiliac atherosclerotic disease. The IVC is unremarkable. No portal venous gas. No adenopathy. Reproductive: The prostate and seminal vesicles are grossly unremarkable. No pelvic mass. Other: None Musculoskeletal: Osteopenia with degenerative changes of the spine. Similar appearance of L2 sclerotic metastatic disease. No acute osseous pathology. Review of the MIP images confirms the above findings. IMPRESSION: 1. No acute intrathoracic, abdominal, or pelvic pathology. No pulmonary artery embolus identified. 2. Overall slight interval increase in the size of bilateral pulmonary metastatic lesions compared to the CT of 05/28/2022. 3. Small right pleural effusion. 4. Similar pattern of osseous metastatic disease  as CT of 05/28/2022. 5. No definite evidence of metastasis in the abdomen and pelvis. 6.  Aortic Atherosclerosis (ICD10-I70.0). Electronically Signed   By: Elgie Collard M.D.   On: 07/15/2022 17:19   CT Head Wo Contrast  Result Date: 07/15/2022 CLINICAL DATA:  Weakness and tachycardia. 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 EXAM: CT HEAD WITHOUT CONTRAST TECHNIQUE: Contiguous axial images were obtained from the base of the skull through the vertex without intravenous contrast. RADIATION DOSE REDUCTION: This exam was performed according to the departmental dose-optimization program which includes automated exposure control, adjustment of the mA and/or kV according to patient size and/or use of iterative reconstruction technique. COMPARISON:  MRI head 06/02/2022 and CT head 05/07/2001 FINDINGS: Brain: No intracranial hemorrhage, mass effect, or evidence of acute infarct. No hydrocephalus. No extra-axial fluid collection. Generalized cerebral atrophy. Ill-defined hypoattenuation within the cerebral white matter is nonspecific but consistent with chronic small vessel ischemic disease. Chronic right thalamic infarcts. Vascular: No hyperdense vessel. Intracranial arterial calcification. Skull: No fracture or focal lesion. Sinuses/Orbits: No acute finding. Paranasal sinuses and mastoid air cells are well aerated. Other: None. IMPRESSION: 1. No evidence of acute intracranial abnormality. Electronically Signed   By: Minerva Fester M.D.   On: 07/15/2022 17:05   DG Chest Port 1 View  Result Date: 07/15/2022 CLINICAL DATA:  Weakness since Monday, SVT, cancer patient, smoker EXAM: PORTABLE CHEST 1 VIEW COMPARISON:  Portable exam 1527 hours compared to 06/25/2022 FINDINGS: External pacing leads. Normal heart size, mediastinal contours, and pulmonary vascularity. Emphysematous changes with multiple small nodular foci in both lungs consistent with pulmonary metastases. Confluence mass identified mid to lateral  RIGHT lung. RIGHT apical pleuroparenchymal opacity unchanged. No new infiltrate or pneumothorax. Trace RIGHT pleural effusion. Bones demineralized. IMPRESSION: Pulmonary metastases greatest lateral mid RIGHT lung. Underlying COPD changes. Emphysema (ICD10-J43.9). Electronically Signed   By: Ulyses Southward M.D.   On: 07/15/2022 15:38    Procedures Procedures    Medications Ordered in ED Medications  amiodarone (NEXTERONE PREMIX) 360-4.14 MG/200ML-% (1.8 mg/mL) IV infusion (60 mg/hr Intravenous New Bag/Given 07/15/22 1522)  amiodarone (NEXTERONE PREMIX) 360-4.14 MG/200ML-% (1.8 mg/mL) IV infusion (has no administration in time range)  potassium chloride SA (KLOR-CON M) CR tablet 40 mEq (has no administration in time range)  adenosine (ADENOCARD) 6 MG/2ML injection 6 mg (6 mg Intravenous Given 07/15/22 1506)  adenosine (ADENOCARD) 6 MG/2ML injection 12 mg (12 mg Intravenous Given 07/15/22 1508)  amiodarone (CORDARONE) injection 150 mg (150 mg Intravenous Given 07/15/22 1518)  diphenhydrAMINE (BENADRYL) injection 25 mg (25 mg Intravenous Given 07/15/22 1607)  potassium chloride 10 mEq in 100 mL IVPB (10 mEq Intravenous New  Bag/Given 07/15/22 1701)  magnesium sulfate IVPB 2 g 50 mL (2 g Intravenous New Bag/Given 07/15/22 1701)  sodium chloride 0.9 % bolus 1,000 mL (1,000 mLs Intravenous New Bag/Given 07/15/22 1702)  iohexol (OMNIPAQUE) 350 MG/ML injection 100 mL (100 mLs Intravenous Contrast Given 07/15/22 1639)    ED Course/ Medical Decision Making/ A&P                             Medical Decision Making Amount and/or Complexity of Data Reviewed Labs: ordered. Radiology: ordered.  Risk Prescription drug management. Decision regarding hospitalization.   This patient presents to the ED for concern of weakness, this involves an extensive number of treatment options, and is a complaint that carries with it a high risk of complications and morbidity.  The differential diagnosis includes electrolyte abn,  infection, PE   Co morbidities that complicate the patient evaluation  Lung cancer   Additional history obtained:  Additional history obtained from epic chart review External records from outside source obtained and reviewed including EMS report   Lab Tests:  I Ordered, and personally interpreted labs.  The pertinent results include:  cbc with wbc 11.5, hgb 11.7 (hgb 13.7 on 4/11), na low at 120 (128 on 3/25), k low at 3.1, mg 1.7, lactic acid 4.2, ddimer elevated at 5.27   Imaging Studies ordered:  I ordered imaging studies including cxr, ct head/chest/abd/pelvis I independently visualized and interpreted imaging which showed  CXR: Pulmonary metastases greatest lateral mid RIGHT lung.    Underlying COPD changes.    Emphysema (ICD10-J43.9).  CT head: No evidence of acute intracranial abnormality.  CT chest/abd/pelvis: No acute intrathoracic, abdominal, or pelvic pathology. No  pulmonary artery embolus identified.  2. Overall slight interval increase in the size of bilateral  pulmonary metastatic lesions compared to the CT of 05/28/2022.  3. Small right pleural effusion.  4. Similar pattern of osseous metastatic disease as CT of  05/28/2022.  5. No definite evidence of metastasis in the abdomen and pelvis.  6.  Aortic Atherosclerosis (ICD10-I70.0).   I agree with the radiologist interpretation   Cardiac Monitoring:  The patient was maintained on a cardiac monitor.  I personally viewed and interpreted the cardiac monitored which showed an underlying rhythm of: SVT   Medicines ordered and prescription drug management:  I ordered medication including adenosine/amio  for sx  Reevaluation of the patient after these medicines showed that the patient improved I have reviewed the patients home medicines and have made adjustments as needed   Test Considered:  ct   Critical Interventions:  amio   Consultations Obtained:  I requested consultation with the  cardiologist (Dr. Wyline Mood),  and discussed lab and imaging findings as well as pertinent plan - he said pt can stay here.  Continue amio drip. Pt d/w Dr. Mariea Clonts (triad) who will admit.   Problem List / ED Course:  SVT:  SVT broke with 6 mg adenosine, but went back into SVT a few minutes later.  We gave him 12 mg adenosine which broke the SVT, but went back into SVT a few minutes later.  During the periods b/t SVT episodes, pt did have some sinus beats, but also some beats w/o p waves.  I was concerned for a re-entrant tachycardia, so I started him on amiodarone.  I did ask Dr. Wyline Mood (cards to see pt). Hypokalemia:  treated with IV KCl Hyponatremia:  worsening.  Likely due to a combination  of poor nutrition and lung cancer.   Reevaluation:  After the interventions noted above, I reevaluated the patient and found that they have :improved   Social Determinants of Health:  Lives at home   Dispostion:  After consideration of the diagnostic results and the patients response to treatment, I feel that the patent would benefit from admission.    CRITICAL CARE Performed by: Jacalyn Lefevre   Total critical care time: 45 minutes  Critical care time was exclusive of separately billable procedures and treating other patients.  Critical care was necessary to treat or prevent imminent or life-threatening deterioration.  Critical care was time spent personally by me on the following activities: development of treatment plan with patient and/or surrogate as well as nursing, discussions with consultants, evaluation of patient's response to treatment, examination of patient, obtaining history from patient or surrogate, ordering and performing treatments and interventions, ordering and review of laboratory studies, ordering and review of radiographic studies, pulse oximetry and re-evaluation of patient's condition.         Final Clinical Impression(s) / ED Diagnoses Final diagnoses:  SVT  (supraventricular tachycardia)  Hypokalemia  Hyponatremia  Primary malignant neoplasm of lung metastatic to other site, unspecified laterality (HCC)  Failure to thrive in adult    Rx / DC Orders ED Discharge Orders     None         Jacalyn Lefevre, MD 07/15/22 Julio Sicks    Jacalyn Lefevre, MD 07/15/22 1805

## 2022-07-15 NOTE — Assessment & Plan Note (Signed)
Dyspnea on exertion, diffuse rhonchi, worsening cough.  Denies diagnosis of COPD.  Ongoing tobacco abuse 1 pack/day.  CTA chest negative for acute abnormality.  O2 sats -DuoNebs as needed and scheduled -Mucolytics as needed -Azithromycin -Solu-Medrol 80mg  x 1, continue 60 twice daily

## 2022-07-16 ENCOUNTER — Inpatient Hospital Stay (HOSPITAL_COMMUNITY): Payer: Medicaid Other

## 2022-07-16 ENCOUNTER — Encounter (HOSPITAL_COMMUNITY): Payer: Self-pay | Admitting: Internal Medicine

## 2022-07-16 ENCOUNTER — Ambulatory Visit (HOSPITAL_COMMUNITY): Admission: RE | Admit: 2022-07-16 | Payer: Medicaid Other | Source: Ambulatory Visit

## 2022-07-16 DIAGNOSIS — I471 Supraventricular tachycardia, unspecified: Secondary | ICD-10-CM | POA: Diagnosis not present

## 2022-07-16 DIAGNOSIS — E43 Unspecified severe protein-calorie malnutrition: Secondary | ICD-10-CM | POA: Insufficient documentation

## 2022-07-16 LAB — ECHOCARDIOGRAM COMPLETE
AR max vel: 1.83 cm2
AV Area VTI: 1.74 cm2
AV Area mean vel: 1.82 cm2
AV Mean grad: 2.4 mmHg
AV Peak grad: 4.6 mmHg
Ao pk vel: 1.07 m/s
Area-P 1/2: 4.17 cm2
Calc EF: 43.6 %
Height: 66 in
S' Lateral: 3.3 cm
Single Plane A2C EF: 41.7 %
Single Plane A4C EF: 48.4 %
Weight: 1742.52 oz

## 2022-07-16 LAB — BASIC METABOLIC PANEL
Anion gap: 7 (ref 5–15)
BUN: 21 mg/dL — ABNORMAL HIGH (ref 6–20)
CO2: 19 mmol/L — ABNORMAL LOW (ref 22–32)
Calcium: 7.7 mg/dL — ABNORMAL LOW (ref 8.9–10.3)
Chloride: 98 mmol/L (ref 98–111)
Creatinine, Ser: 0.75 mg/dL (ref 0.61–1.24)
GFR, Estimated: >60 mL/min (ref >60)
Glucose, Bld: 135 mg/dL — ABNORMAL HIGH (ref 70–99)
Potassium: 4.3 mmol/L (ref 3.5–5.1)
Sodium: 124 mmol/L — ABNORMAL LOW (ref 135–145)

## 2022-07-16 LAB — CBC
HCT: 30.4 % — ABNORMAL LOW (ref 39.0–52.0)
Hemoglobin: 10.5 g/dL — ABNORMAL LOW (ref 13.0–17.0)
MCH: 31.5 pg (ref 26.0–34.0)
MCHC: 34.5 g/dL (ref 30.0–36.0)
MCV: 91.3 fL (ref 80.0–100.0)
Platelets: 149 10*3/uL — ABNORMAL LOW (ref 150–400)
RBC: 3.33 MIL/uL — ABNORMAL LOW (ref 4.22–5.81)
RDW: 15.1 % (ref 11.5–15.5)
WBC: 7.8 10*3/uL (ref 4.0–10.5)
nRBC: 0 % (ref 0.0–0.2)

## 2022-07-16 LAB — HIV ANTIBODY (ROUTINE TESTING W REFLEX): HIV Screen 4th Generation wRfx: NONREACTIVE

## 2022-07-16 LAB — SODIUM, URINE, RANDOM: Sodium, Ur: <10 mmol/L

## 2022-07-16 LAB — MAGNESIUM: Magnesium: 2.2 mg/dL (ref 1.7–2.4)

## 2022-07-16 MED ORDER — METOPROLOL SUCCINATE ER 25 MG PO TB24
25.0000 mg | ORAL_TABLET | Freq: Every day | ORAL | Status: DC
  Administered 2022-07-16 – 2022-07-23 (×6): 25 mg via ORAL
  Filled 2022-07-16 (×6): qty 1

## 2022-07-16 MED ORDER — BOOST PLUS PO LIQD
237.0000 mL | Freq: Three times a day (TID) | ORAL | Status: DC
  Administered 2022-07-17 – 2022-07-20 (×3): 237 mL via ORAL
  Filled 2022-07-16 (×28): qty 237

## 2022-07-16 MED ORDER — HYDRALAZINE HCL 20 MG/ML IJ SOLN
5.0000 mg | Freq: Four times a day (QID) | INTRAMUSCULAR | Status: DC | PRN
  Administered 2022-07-16 – 2022-07-19 (×4): 5 mg via INTRAVENOUS
  Filled 2022-07-16 (×4): qty 1

## 2022-07-16 MED ORDER — PREDNISONE 20 MG PO TABS: 40.0000 mg | ORAL_TABLET | Freq: Every day | ORAL | Status: DC

## 2022-07-16 MED ORDER — OCUVITE-LUTEIN PO CAPS
1.0000 | ORAL_CAPSULE | Freq: Every day | ORAL | Status: DC
  Administered 2022-07-16 – 2022-07-23 (×6): 1 via ORAL
  Filled 2022-07-16 (×6): qty 1

## 2022-07-16 MED ORDER — IPRATROPIUM-ALBUTEROL 0.5-2.5 (3) MG/3ML IN SOLN
3.0000 mL | Freq: Four times a day (QID) | RESPIRATORY_TRACT | Status: DC
  Administered 2022-07-16 – 2022-07-17 (×3): 3 mL via RESPIRATORY_TRACT
  Filled 2022-07-16 (×3): qty 3

## 2022-07-16 MED ORDER — AMIODARONE HCL 200 MG PO TABS
200.0000 mg | ORAL_TABLET | Freq: Two times a day (BID) | ORAL | Status: DC
  Administered 2022-07-16: 200 mg via ORAL
  Filled 2022-07-16 (×2): qty 1

## 2022-07-16 MED ORDER — LABETALOL HCL 5 MG/ML IV SOLN
5.0000 mg | INTRAVENOUS | Status: DC | PRN
  Administered 2022-07-16 – 2022-07-17 (×6): 5 mg via INTRAVENOUS
  Filled 2022-07-16 (×5): qty 4

## 2022-07-16 MED ORDER — LOSARTAN POTASSIUM 25 MG PO TABS
25.0000 mg | ORAL_TABLET | Freq: Every day | ORAL | Status: DC
  Administered 2022-07-16: 25 mg via ORAL
  Filled 2022-07-16: qty 1

## 2022-07-16 MED ORDER — PROSOURCE PLUS PO LIQD
30.0000 mL | Freq: Three times a day (TID) | ORAL | Status: DC
  Administered 2022-07-19 – 2022-07-23 (×8): 30 mL via ORAL
  Filled 2022-07-16 (×9): qty 30

## 2022-07-16 NOTE — Discharge Instructions (Addendum)
Nutrition Post Hospital Stay Proper nutrition can help your body recover from illness and injury.   Foods and beverages high in protein, vitamins, and minerals help rebuild muscle loss, promote healing, & reduce fall risk.   In addition to eating healthy foods, a nutrition shake is an easy, delicious way to get the nutrition you need during and after your hospital stay  It is recommended that you continue to drink 2 bottles per day of: High protien/high calorie nutrition shakes for at least 1 month (30 days) after your hospital stay   Tips for adding a nutrition shake into your routine: As allowed, drink one with vitamins or medications instead of water or juice Enjoy one as a tasty mid-morning or afternoon snack Drink cold or make a milkshake out of it Drink one instead of milk with cereal or snacks Use as a coffee creamer   Available at the following grocery stores and pharmacies:           * Karin Golden * [employer] * [employer]co  * Rite Aid          * Walmart * [employer]  * Walgreens      * Target  * BJ's   * CVS  * [employer]   * [facility] [phone]            For COUPONS visit: www.ensure.com/join or RoleLink.com.br   Suggested Substitutions Ensure Plus = Boost Plus = Carnation Breakfast Essentials = Boost Compact Ensure Active Clear = Boost Breeze Glucerna Shake = Boost Glucose Control = Carnation Breakfast Essentials SUGAR FREE   Providers Accepting New Patients in [place], [place]  [facility] [address]  [place], [place] [address] [phone] Accepts most insurances  [facility] [address] [place], [place] [address] [phone] Accepts most insurances  [facility] [address] [place], [place] [address]  [phone] Must meet requirements  [facility] [address] [place], [place] [address] [phone] Accepts most insurances  [facility] [address]  [place], [place] [address] [phone] Accepts most insurances  [facility] [address]   [place], [place]   [phone] Accepts most insurances  [facility])  [phone] [address]  [place], [place] [address] [phone] Accepts most insurances  [facility] [address]  [place], [place] [address] [phone] Accepts most insurances  [facility] [address] [place], [place] [address] [phone] option 1 Accepts Medicaid and [facility] [address]  [place], [place] [address] [phone] Accepts most insurances  [place]on Gully, MD [address] [place], [place] [address] [phone] Accepts most insurances  [facility] at [facility] [address]  [place], [place] [address] [phone] Accepts most insurances  [facility] [phone] [place], [place] [address] [phone] Accepts most insurances  [place], [place] [address] [place], [place] [address] [phone]  Accepts most insurances

## 2022-07-16 NOTE — Progress Notes (Signed)
  Echocardiogram 2D Echocardiogram has been performed.  [name] 07/16/2022, 11:57 AM

## 2022-07-16 NOTE — TOC Progression Note (Signed)
Transition of Care [facility]) - Progression Note    Patient Details  Name: [name] Stone MRN: [id number] Date of Birth: June 30, 1960  Transition of Care [facility]) CM/SW Contact  Karn Cassis, [place] Phone Number: 07/16/2022, 9:59 AM  Clinical Narrative:   Transition of Care [facility]) Screening Note   Patient Details  Name: [name] Stone Date of Birth: 1960-10-19   Transition of Care [facility]) CM/SW Contact:    Karn Cassis, LCSW Phone Number: 07/16/2022, 10:00 AM    Transition of Care Department [facility]) has reviewed patient and no TOC needs have been identified at this time. We will continue to monitor patient advancement through interdisciplinary progression rounds. If new patient transition needs arise, please place a TOC consult.         Barriers to Discharge: Continued Medical Work up  Expected Discharge Plan and Services                                               Social Determinants of Health (SDOH) Interventions SDOH Screenings   Food Insecurity: No Food Insecurity (07/15/2022)  Housing: Low Risk  (07/15/2022)  Transportation Needs: No Transportation Needs (07/15/2022)  Utilities: Not At Risk (07/15/2022)  Depression (PHQ2-9): Low Risk  (06/08/2022)  Tobacco Use: High Risk (07/15/2022)    Readmission Risk Interventions     No data to display

## 2022-07-16 NOTE — Progress Notes (Signed)
Initial Nutrition Assessment  DOCUMENTATION CODES:   Severe malnutrition in context of chronic illness  Underweight  INTERVENTION:  Boost Plus BID between meals  ProStat 30 ml TID (each 30 ml provides 100 kcal, 15 gr protein)   Multivitamin daily  Malnutrition in Adult (education attached to AVS)   NUTRITION DIAGNOSIS:   Severe Malnutrition related to cancer and cancer related treatments as evidenced by severe muscle depletion, severe fat depletion, unplanned weight loss.    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 Presented to ED with complaint of weakness. Acute bronchitis, SVT, AKI and hypokalemia per MD.   Patient scheduled for port placement on 07/21/22 at [facility]. Palliative chemotherapy to follow.   Patient from home and lives alone. Usual eating pattern 2 meals daily. Appetite has been diminished. He likes chocolate Boost. Encouraged patient to consume high protein /high calorie ONS- BID given his unwanted weight loss and increased needs.  Regular diet and self-reports to have eaten 75% of breakfast and 50% of lunch today. Independent with feeding. Denies chew/ swallow difficulty. Patient started on Megace for appetite per oncology.   Patient weights reviewed. A 20 lb wt loss in 6 months and 8 lb the past month per chart.   Medications reviewed and include: megace, solumedrol, pacerone     Latest Ref Rng & Units 07/16/2022    5:20 AM 07/15/2022    3:21 PM 07/15/2022    3:06 PM  BMP  Glucose 70 - 99 mg/dL 409  811  914   BUN 6 - 20 mg/dL 21  34  29   Creatinine 0.61 - 1.24 mg/dL 7.82  9.56  2.13   Sodium 135 - 145 mmol/L 124  123  120   Potassium 3.5 - 5.1  mmol/L 4.3  4.1  3.1   Chloride 98 - 111 mmol/L 98  90  87   CO2 22 - 32 mmol/L 19   20   Calcium 8.9 - 10.3 mg/dL 7.7   7.8       NUTRITION - FOCUSED PHYSICAL EXAM:  Flowsheet Row Most Recent Value  Orbital Region Moderate depletion  Upper Arm Region Severe depletion  Thoracic and Lumbar Region Severe depletion  Buccal Region Moderate depletion  Temple Region Moderate depletion  Clavicle Bone Region Moderate depletion  Clavicle and Acromion Bone Region Severe depletion  Scapular Bone Region Severe depletion  Dorsal Hand Moderate depletion  Patellar Region Severe depletion  Anterior Thigh Region Severe depletion  Posterior Calf Region Moderate depletion  Edema (RD Assessment) Mild  Hair Reviewed  Eyes Reviewed  Mouth Reviewed  Skin Reviewed  Nails Reviewed      Diet Order:   Diet Order             Diet regular Fluid consistency: Thin  Diet effective now                   EDUCATION NEEDS:  Education needs have been addressed  Skin:  Skin Assessment: Reviewed RN Assessment  Last BM:  4/30 large type 6  Height:   Ht Readings from Last 1 Encounters:  07/15/22 5\' 6"  (1.676 m)    Weight:  Wt Readings from Last 1 Encounters:  07/16/22 49.4 kg    Ideal Body Weight:   65 kg  BMI:  Body mass index is 17.58 kg/m.  Estimated Nutritional Needs:   Kcal:  1800-2100  Protein:  75-80 gr  Fluid:  >1500 ml daily  Royann Shivers MS,RD,CSG,LDN Contact: Loretha Stapler

## 2022-07-16 NOTE — Progress Notes (Signed)
Rounding Note    Patient Name: [name] Stone Date of Encounter: 07/16/2022  [facility] Cardiologist: New  Subjective   No complaints  Inpatient Medications    Scheduled Meds:  guaiFENesin-dextromethorphan  15 mL Oral Q8H   heparin  5,000 Units Subcutaneous Q8H   ipratropium-albuterol  3 mL Nebulization Q8H   megestrol  400 mg Oral BID   methylPREDNISolone (SOLU-MEDROL) injection  60 mg Intravenous Q12H   Continuous Infusions:  sodium chloride 100 mL/hr at 07/15/22 2116   amiodarone 30 mg/hr (07/15/22 2137)   azithromycin 500 mg (07/15/22 2122)   PRN Meds: acetaminophen **OR** acetaminophen, ipratropium-albuterol, ondansetron **OR** ondansetron (ZOFRAN) IV, polyethylene glycol   Vital Signs    Vitals:   07/16/22 0500 07/16/22 0538 07/16/22 0600 07/16/22 0742  BP: 133/87  (!) 147/87   Pulse: 91  100   Resp: 18  13   Temp:    98.3 F (36.8 C)  TempSrc:    Oral  SpO2: 93% 93% 95%   Weight:      Height:        Intake/Output Summary (Last 24 hours) at 07/16/2022 0816 Last data filed at 07/16/2022 0743 Gross per 24 hour  Intake 2091.29 ml  Output 300 ml  Net 1791.29 ml      07/16/2022    4:53 AM 07/15/2022    8:31 PM 07/15/2022    3:57 PM  Last 3 Weights  Weight (lbs) 108 lb 14.5 oz 102 lb 1.2 oz 98 lb  Weight (kg) 49.4 kg 46.3 kg 44.453 kg      Telemetry    NSR - Personally Reviewed  ECG    N/a - Personally Reviewed  Physical Exam   GEN: No acute distress.   Neck: No JVD Cardiac: RRR, no murmurs, rubs, or gallops.  Respiratory: expiratory coarse breath sounds GI: Soft, nontender, non-distended  MS: No edema; No deformity. Neuro:  Nonfocal  Psych: Normal affect   Labs    High Sensitivity Troponin:   Recent Labs  Lab 07/15/22 1506 07/15/22 1737  TROPONINIHS 54* 54*     Chemistry Recent Labs  Lab 07/15/22 1506 07/15/22 1521 07/16/22 0520  NA 120* 123* 124*  K 3.1* 4.1 4.3  CL 87* 90* 98  CO2 20*  --  19*  GLUCOSE 116* 126*  135*  BUN 29* 34* 21*  CREATININE 1.23 1.00 0.75  CALCIUM 7.8*  --  7.7*  MG 1.7  --   --   GFRNONAA >60  --  >60  ANIONGAP 13  --  7    Lipids No results for input(s): "CHOL", "TRIG", "HDL", "LABVLDL", "LDLCALC", "CHOLHDL" in the last 168 hours.  Hematology Recent Labs  Lab 07/15/22 1506 07/15/22 1521 07/16/22 0520  WBC 11.5*  --  7.8  RBC 3.72*  --  3.33*  HGB 11.7* 11.6* 10.5*  HCT 34.4* 34.0* 30.4*  MCV 92.5  --  91.3  MCH 31.5  --  31.5  MCHC 34.0  --  34.5  RDW 14.8  --  15.1  PLT 178  --  149*   Thyroid  Recent Labs  Lab 07/15/22 1737  TSH 1.387    BNPNo results for input(s): "BNP", "PROBNP" in the last 168 hours.  DDimer  Recent Labs  Lab 07/15/22 1541  DDIMER 5.27*     Radiology    CT Angio Chest PE W and/or Wo Contrast  Result Date: 07/15/2022 CLINICAL DATA:  Abdominal trauma. Concern for pulmonary embolism. Lung  cancer. EXAM: CT ANGIOGRAPHY CHEST CT ABDOMEN AND PELVIS WITH CONTRAST TECHNIQUE: Multidetector CT imaging of the chest was performed using the standard protocol during bolus administration of intravenous contrast. Multiplanar CT image reconstructions and MIPs were obtained to evaluate the vascular anatomy. Multidetector CT imaging of the abdomen and pelvis was performed using the standard protocol during bolus administration of intravenous contrast. RADIATION DOSE REDUCTION: This exam was performed according to the departmental dose-optimization program which includes automated exposure control, adjustment of the mA and/or kV according to patient size and/or use of iterative reconstruction technique. CONTRAST:  OMNIPAQUE IOHEXOL 350 MG/ML SOLN COMPARISON:  Chest radiograph dated 07/15/2022. PET CT dated 06/04/2022.Chest CT dated 05/28/2022. FINDINGS: Evaluation of this exam is limited due to respiratory motion artifact. CTA CHEST FINDINGS Cardiovascular: There is no cardiomegaly or pericardial effusion. Mild atherosclerotic calcification of the  thoracic aorta. No aneurysmal dilatation or dissection. No pulmonary artery embolus identified. Mediastinum/Nodes: Right hilar and subcarinal adenopathy relatively similar to the CT of 05/28/2022. The esophagus is grossly unremarkable. No mediastinal fluid collection. Lungs/Pleura: Overall slight interval increase in the size of bilateral pulmonary metastatic lesions seen space CT of 05/28/2022. For example a 1.5 cm nodule in the left lower lobe (104/8) previously measured approximately 1.0 cm. There is a small right pleural effusion. No pneumothorax. The central airways are patent. Musculoskeletal: Osteopenia with degenerative changes of the spine. Similar pattern osseous metastatic disease as CT of 05/28/2022. No acute osseous pathology. Loss of subcutaneous fat and cachexia. Review of the MIP images confirms the above findings. CT ABDOMEN and PELVIS FINDINGS No intra-abdominal free air or free fluid. Hepatobiliary: The liver is unremarkable. No biliary dilatation. The gallbladder is unremarkable. Pancreas: No acute findings. Spleen: Normal in size without focal abnormality. Adrenals/Urinary Tract: The adrenal glands are unremarkable. Kidneys, visualized ureters, and urinary bladder appear unremarkable. Stomach/Bowel: There is no bowel obstruction or active inflammation. The appendix is normal. Vascular/Lymphatic: Mild aortoiliac atherosclerotic disease. The IVC is unremarkable. No portal venous gas. No adenopathy. Reproductive: The prostate and seminal vesicles are grossly unremarkable. No pelvic mass. Other: None Musculoskeletal: Osteopenia with degenerative changes of the spine. Similar appearance of L2 sclerotic metastatic disease. No acute osseous pathology. Review of the MIP images confirms the above findings. IMPRESSION: 1. No acute intrathoracic, abdominal, or pelvic pathology. No pulmonary artery embolus identified. 2. Overall slight interval increase in the size of bilateral pulmonary metastatic lesions  compared to the CT of 05/28/2022. 3. Small right pleural effusion. 4. Similar pattern of osseous metastatic disease as CT of 05/28/2022. 5. No definite evidence of metastasis in the abdomen and pelvis. 6.  Aortic Atherosclerosis (ICD10-I70.0). Electronically Signed   By: Elgie Collard M.D.   On: 07/15/2022 17:19   CT ABDOMEN PELVIS W CONTRAST  Result Date: 07/15/2022 CLINICAL DATA:  Abdominal trauma. Concern for pulmonary embolism. Lung cancer. EXAM: CT ANGIOGRAPHY CHEST CT ABDOMEN AND PELVIS WITH CONTRAST TECHNIQUE: Multidetector CT imaging of the chest was performed using the standard protocol during bolus administration of intravenous contrast. Multiplanar CT image reconstructions and MIPs were obtained to evaluate the vascular anatomy. Multidetector CT imaging of the abdomen and pelvis was performed using the standard protocol during bolus administration of intravenous contrast. RADIATION DOSE REDUCTION: This exam was performed according to the departmental dose-optimization program which includes automated exposure control, adjustment of the mA and/or kV according to patient size and/or use of iterative reconstruction technique. CONTRAST:  OMNIPAQUE IOHEXOL 350 MG/ML SOLN COMPARISON:  Chest radiograph dated 07/15/2022. PET CT  dated 06/04/2022.Chest CT dated 05/28/2022. FINDINGS: Evaluation of this exam is limited due to respiratory motion artifact. CTA CHEST FINDINGS Cardiovascular: There is no cardiomegaly or pericardial effusion. Mild atherosclerotic calcification of the thoracic aorta. No aneurysmal dilatation or dissection. No pulmonary artery embolus identified. Mediastinum/Nodes: Right hilar and subcarinal adenopathy relatively similar to the CT of 05/28/2022. The esophagus is grossly unremarkable. No mediastinal fluid collection. Lungs/Pleura: Overall slight interval increase in the size of bilateral pulmonary metastatic lesions seen space CT of 05/28/2022. For example a 1.5 cm nodule in the  left lower lobe (104/8) previously measured approximately 1.0 cm. There is a small right pleural effusion. No pneumothorax. The central airways are patent. Musculoskeletal: Osteopenia with degenerative changes of the spine. Similar pattern osseous metastatic disease as CT of 05/28/2022. No acute osseous pathology. Loss of subcutaneous fat and cachexia. Review of the MIP images confirms the above findings. CT ABDOMEN and PELVIS FINDINGS No intra-abdominal free air or free fluid. Hepatobiliary: The liver is unremarkable. No biliary dilatation. The gallbladder is unremarkable. Pancreas: No acute findings. Spleen: Normal in size without focal abnormality. Adrenals/Urinary Tract: The adrenal glands are unremarkable. Kidneys, visualized ureters, and urinary bladder appear unremarkable. Stomach/Bowel: There is no bowel obstruction or active inflammation. The appendix is normal. Vascular/Lymphatic: Mild aortoiliac atherosclerotic disease. The IVC is unremarkable. No portal venous gas. No adenopathy. Reproductive: The prostate and seminal vesicles are grossly unremarkable. No pelvic mass. Other: None Musculoskeletal: Osteopenia with degenerative changes of the spine. Similar appearance of L2 sclerotic metastatic disease. No acute osseous pathology. Review of the MIP images confirms the above findings. IMPRESSION: 1. No acute intrathoracic, abdominal, or pelvic pathology. No pulmonary artery embolus identified. 2. Overall slight interval increase in the size of bilateral pulmonary metastatic lesions compared to the CT of 05/28/2022. 3. Small right pleural effusion. 4. Similar pattern of osseous metastatic disease as CT of 05/28/2022. 5. No definite evidence of metastasis in the abdomen and pelvis. 6.  Aortic Atherosclerosis (ICD10-I70.0). Electronically Signed   By: Elgie Collard M.D.   On: 07/15/2022 17:19   CT Head Wo Contrast  Result Date: 07/15/2022 CLINICAL DATA:  Weakness and tachycardia. Stage IV metastatic  adenocarcinoma of the lung. EXAM: CT HEAD WITHOUT CONTRAST TECHNIQUE: Contiguous axial images were obtained from the base of the skull through the vertex without intravenous contrast. RADIATION DOSE REDUCTION: This exam was performed according to the departmental dose-optimization program which includes automated exposure control, adjustment of the mA and/or kV according to patient size and/or use of iterative reconstruction technique. COMPARISON:  MRI head 06/02/2022 and CT head 05/07/2001 FINDINGS: Brain: No intracranial hemorrhage, mass effect, or evidence of acute infarct. No hydrocephalus. No extra-axial fluid collection. Generalized cerebral atrophy. Ill-defined hypoattenuation within the cerebral white matter is nonspecific but consistent with chronic small vessel ischemic disease. Chronic right thalamic infarcts. Vascular: No hyperdense vessel. Intracranial arterial calcification. Skull: No fracture or focal lesion. Sinuses/Orbits: No acute finding. Paranasal sinuses and mastoid air cells are well aerated. Other: None. IMPRESSION: 1. No evidence of acute intracranial abnormality. Electronically Signed   By: Minerva Fester M.D.   On: 07/15/2022 17:05   DG Chest Port 1 View  Result Date: 07/15/2022 CLINICAL DATA:  Weakness since Monday, SVT, cancer patient, smoker EXAM: PORTABLE CHEST 1 VIEW COMPARISON:  Portable exam 1527 hours compared to 06/25/2022 FINDINGS: External pacing leads. Normal heart size, mediastinal contours, and pulmonary vascularity. Emphysematous changes with multiple small nodular foci in both lungs consistent with pulmonary metastases. Confluence mass identified mid to  lateral RIGHT lung. RIGHT apical pleuroparenchymal opacity unchanged. No new infiltrate or pneumothorax. Trace RIGHT pleural effusion. Bones demineralized. IMPRESSION: Pulmonary metastases greatest lateral mid RIGHT lung. Underlying COPD changes. Emphysema (ICD10-J43.9). Electronically Signed   By: Ulyses Southward M.D.   On:  07/15/2022 15:38    Cardiac Studies     Patient Profile     SHAW DOBEK is a 62 y.o. male with a hx of metastatic lung cancer  who is being seen 07/15/2022 for the evaluation of tachycardia  at the request of Dr Angelena Sole.   Assessment & Plan    1.SVT - narrow complex regular tach 220 on presentation. Did not break with adenososine.   At slower rates evidence of  a long RP tachycardia consistent with atach - K 3.1 (though normal by istat), Mg 1.7 on admission - soft bp's initially, patient already started on IV amiodarone by ER staff - SVT in setting of low K, Mg, metastatic cancer, possible bronchitis elevated lactic acid.   -back in NSR this AM. This afternoon after 24 hour total load can transition IV amiodarone to oral 200mg  bid x 3 weeks then 200mg  daily.  - echo is pending     2. Metastatic lung CA - per primary team   3. Hyponatremia - per primary team     We will follow up echo today and telemetry tomorrow        For questions or updates, please contact [facility] Please consult [email or web address] for contact info under        Signed, Dina Rich, MD  07/16/2022, 8:16 AM

## 2022-07-16 NOTE — Progress Notes (Signed)
PROGRESS NOTE    [name] Stone  ZOX:[id number] DOB: 1960-05-27 DOA: 07/15/2022 PCP: Patient, No Pcp Per    Brief Narrative:   NIKASH MORTENSEN is a 62 y.o. male with medical history significant for recent lung cancer diagnosis, tobacco abuse.  Patient was brought to the ED via EMS with reports of generalized weakness, 3 falls over the past 24 hours.  EMS found the patient to be in and out of SVT.  Patient denies palpitation, no chest pain.  Reports some dizziness when standing, and difficulty breathing with exertion.  No lower extremity swelling.  No fevers no chills.  He has chronic cough that has worsened over the past few days, with some rattling. He has had chronic poor oral intake, with weight loss. He was recently diagnosed with metastatic lung cancer, and has been evaluated by Dr. Ellin Saba, he has not started therapy.  Assessment and Plan: * SVT (supraventricular tachycardia) Heart rates up to 180s.  Temporary break with 6 mg and then 12 mg of adenosine, and was back in SVT.  150 mg amiodarone bolus given, currently on amiodarone drip.  Heart rate controlled in the 100s.  No cardiac history.  No chest pain.  No palpitation.  Reports some dizziness, dyspnea on exertion.  Hypokalemia 3.1.  Magnesium 1.7.  Was hypotensive to 70s, with resultant lactic acidosis. - Cardiology consulted -Replete potassium. -Echocardiogram pending -Lactic acidosis trending down  Acute bronchitis Dyspnea on exertion, diffuse rhonchi, worsening cough.  Denies diagnosis of COPD.  Ongoing tobacco abuse 1 pack/day.  CTA chest negative for acute abnormality.  O2 sats -DuoNebs as needed and scheduled -Mucolytics as needed -Azithromycin -Solu-Medrol 80mg  x 1, continue 60 twice daily-- change to PO in the AM?  AKI (acute kidney injury) (HCC) CR- 1.23.  Baseline about 0.7.  Likely due to poor oral intake, hypotension. - resolved  Hyponatremia Sodium 120.  Chronic hyponatremia, over the past months sodium has  been 1 23-1 28.  Likely from poor oral intake, occasional vomiting.] - trend -check urine Na  Hypokalemia Potassium 3.1.  From poor oral intake.  Magnesium 1.7. -Replete mag and K.  Metastatic cancer (HCC) Following with Dr. Ellin Saba.  New diagnosis of non-small cell lung cancer.  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  CT chest 05/28/22 showed numerous bilateral lung nodules.  Right brain no metastatic disease, PET scan 3/25 - Innumerable bilateral hypermetabolic lung nodules, lymphadenopathy.   - He is to start chemotherapy next week after port placement by IR (port rescheduled for 5/7 at [facility]).  Intent of treatment being palliative. -Resume Marinol    DVT prophylaxis: heparin injection 5,000 Units Start: 07/15/22 2200    Code Status: Full Code Family Communication: at bedside  Disposition Plan:  Level of care: Stepdown Status is: Inpatient Remains inpatient appropriate because: needs IV meds, echo    Consultants:  cards   Subjective: Denies SOB-- feels like breathing is baseline  Objective: Vitals:   07/16/22 0700 07/16/22 0742 07/16/22 0900 07/16/22 1100  BP: 134/80  (!) 150/88   Pulse: 95  92   Resp: 19  (!) 25   Temp:  98.3 F (36.8 C)  98.3 F (36.8 C)  TempSrc:  Oral  Oral  SpO2: 96%  97%   Weight:      Height:        Intake/Output Summary (Last 24 hours) at 07/16/2022 1120 Last data filed at 07/16/2022 0926 Gross per 24 hour  Intake 2091.29 ml  Output 300 ml  Net 1791.29 ml   Filed Weights   07/15/22 1557 07/15/22 2031 07/16/22 0453  Weight: 44.5 kg 46.3 kg 49.4 kg    Examination:   General: Appearance:    Thin male in no acute distress     Lungs:     Rhonchus on right, few expiratory wheezes, respirations unlabored- not on O2  Heart:    Normal heart rate.    MS:   All extremities are intact.    Neurologic:   Awake, alert, oriented x 3. No apparent focal neurological           defect.        Data Reviewed: I have personally reviewed  following labs and imaging studies  CBC: Recent Labs  Lab 07/15/22 1506 07/15/22 1521 07/16/22 0520  WBC 11.5*  --  7.8  HGB 11.7* 11.6* 10.5*  HCT 34.4* 34.0* 30.4*  MCV 92.5  --  91.3  PLT 178  --  149*   Basic Metabolic Panel: Recent Labs  Lab 07/15/22 1506 07/15/22 1521 07/16/22 0520  NA 120* 123* 124*  K 3.1* 4.1 4.3  CL 87* 90* 98  CO2 20*  --  19*  GLUCOSE 116* 126* 135*  BUN 29* 34* 21*  CREATININE 1.23 1.00 0.75  CALCIUM 7.8*  --  7.7*  MG 1.7  --  2.2   GFR: Estimated Creatinine Clearance: 68.6 mL/min (by C-G formula based on SCr of 0.75 mg/dL). Liver Function Tests: No results for input(s): "AST", "ALT", "ALKPHOS", "BILITOT", "PROT", "ALBUMIN" in the last 168 hours. No results for input(s): "LIPASE", "AMYLASE" in the last 168 hours. No results for input(s): "AMMONIA" in the last 168 hours. Coagulation Profile: No results for input(s): "INR", "PROTIME" in the last 168 hours. Cardiac Enzymes: No results for input(s): "CKTOTAL", "CKMB", "CKMBINDEX", "TROPONINI" in the last 168 hours. BNP (last 3 results) No results for input(s): "PROBNP" in the last 8760 hours. HbA1C: No results for input(s): "HGBA1C" in the last 72 hours. CBG: No results for input(s): "GLUCAP" in the last 168 hours. Lipid Profile: No results for input(s): "CHOL", "HDL", "LDLCALC", "TRIG", "CHOLHDL", "LDLDIRECT" in the last 72 hours. Thyroid Function Tests: Recent Labs    07/15/22 1737  TSH 1.387   Anemia Panel: No results for input(s): "VITAMINB12", "FOLATE", "FERRITIN", "TIBC", "IRON", "RETICCTPCT" in the last 72 hours. Sepsis Labs: Recent Labs  Lab 07/15/22 1518 07/15/22 1737 07/15/22 1957  LATICACIDVEN 4.2* 4.5* 2.6*    Recent Results (from the past 240 hour(s))  MRSA Next Gen by PCR, Nasal     Status: None   Collection Time: 07/15/22  8:35 PM   Specimen: Nasal Mucosa; Nasal Swab  Result Value Ref Range Status   MRSA by PCR Next Gen NOT DETECTED NOT DETECTED Final     Comment: (NOTE) The GeneXpert MRSA Assay (FDA approved for NASAL specimens only), is one component of a comprehensive MRSA colonization surveillance program. It is not intended to diagnose MRSA infection nor to guide or monitor treatment for MRSA infections. Test performance is not FDA approved in patients less than 32 years old. Performed at [facility], [address] [place], [place] [address]          Radiology Studies: CT Angio Chest PE W and/or Wo Contrast  Result Date: 07/15/2022 CLINICAL DATA:  Abdominal trauma. Concern for pulmonary embolism. Lung cancer. EXAM: CT ANGIOGRAPHY CHEST CT ABDOMEN AND PELVIS WITH CONTRAST TECHNIQUE: Multidetector CT imaging of the chest was performed using the standard protocol during bolus  administration of intravenous contrast. Multiplanar CT image reconstructions and MIPs were obtained to evaluate the vascular anatomy. Multidetector CT imaging of the abdomen and pelvis was performed using the standard protocol during bolus administration of intravenous contrast. RADIATION DOSE REDUCTION: This exam was performed according to the departmental dose-optimization program which includes automated exposure control, adjustment of the mA and/or kV according to patient size and/or use of iterative reconstruction technique. CONTRAST:  OMNIPAQUE IOHEXOL 350 MG/ML SOLN COMPARISON:  Chest radiograph dated 07/15/2022. PET CT dated 06/04/2022.Chest CT dated 05/28/2022. FINDINGS: Evaluation of this exam is limited due to respiratory motion artifact. CTA CHEST FINDINGS Cardiovascular: There is no cardiomegaly or pericardial effusion. Mild atherosclerotic calcification of the thoracic aorta. No aneurysmal dilatation or dissection. No pulmonary artery embolus identified. Mediastinum/Nodes: Right hilar and subcarinal adenopathy relatively similar to the CT of 05/28/2022. The esophagus is grossly unremarkable. No mediastinal fluid collection. Lungs/Pleura: Overall slight  interval increase in the size of bilateral pulmonary metastatic lesions seen space CT of 05/28/2022. For example a 1.5 cm nodule in the left lower lobe (104/8) previously measured approximately 1.0 cm. There is a small right pleural effusion. No pneumothorax. The central airways are patent. Musculoskeletal: Osteopenia with degenerative changes of the spine. Similar pattern osseous metastatic disease as CT of 05/28/2022. No acute osseous pathology. Loss of subcutaneous fat and cachexia. Review of the MIP images confirms the above findings. CT ABDOMEN and PELVIS FINDINGS No intra-abdominal free air or free fluid. Hepatobiliary: The liver is unremarkable. No biliary dilatation. The gallbladder is unremarkable. Pancreas: No acute findings. Spleen: Normal in size without focal abnormality. Adrenals/Urinary Tract: The adrenal glands are unremarkable. Kidneys, visualized ureters, and urinary bladder appear unremarkable. Stomach/Bowel: There is no bowel obstruction or active inflammation. The appendix is normal. Vascular/Lymphatic: Mild aortoiliac atherosclerotic disease. The IVC is unremarkable. No portal venous gas. No adenopathy. Reproductive: The prostate and seminal vesicles are grossly unremarkable. No pelvic mass. Other: None Musculoskeletal: Osteopenia with degenerative changes of the spine. Similar appearance of L2 sclerotic metastatic disease. No acute osseous pathology. Review of the MIP images confirms the above findings. IMPRESSION: 1. No acute intrathoracic, abdominal, or pelvic pathology. No pulmonary artery embolus identified. 2. Overall slight interval increase in the size of bilateral pulmonary metastatic lesions compared to the CT of 05/28/2022. 3. Small right pleural effusion. 4. Similar pattern of osseous metastatic disease as CT of 05/28/2022. 5. No definite evidence of metastasis in the abdomen and pelvis. 6.  Aortic Atherosclerosis (ICD10-I70.0). Electronically Signed   By: Elgie Collard M.D.    On: 07/15/2022 17:19   CT ABDOMEN PELVIS W CONTRAST  Result Date: 07/15/2022 CLINICAL DATA:  Abdominal trauma. Concern for pulmonary embolism. Lung cancer. EXAM: CT ANGIOGRAPHY CHEST CT ABDOMEN AND PELVIS WITH CONTRAST TECHNIQUE: Multidetector CT imaging of the chest was performed using the standard protocol during bolus administration of intravenous contrast. Multiplanar CT image reconstructions and MIPs were obtained to evaluate the vascular anatomy. Multidetector CT imaging of the abdomen and pelvis was performed using the standard protocol during bolus administration of intravenous contrast. RADIATION DOSE REDUCTION: This exam was performed according to the departmental dose-optimization program which includes automated exposure control, adjustment of the mA and/or kV according to patient size and/or use of iterative reconstruction technique. CONTRAST:  OMNIPAQUE IOHEXOL 350 MG/ML SOLN COMPARISON:  Chest radiograph dated 07/15/2022. PET CT dated 06/04/2022.Chest CT dated 05/28/2022. FINDINGS: Evaluation of this exam is limited due to respiratory motion artifact. CTA CHEST FINDINGS Cardiovascular: There is no cardiomegaly or  pericardial effusion. Mild atherosclerotic calcification of the thoracic aorta. No aneurysmal dilatation or dissection. No pulmonary artery embolus identified. Mediastinum/Nodes: Right hilar and subcarinal adenopathy relatively similar to the CT of 05/28/2022. The esophagus is grossly unremarkable. No mediastinal fluid collection. Lungs/Pleura: Overall slight interval increase in the size of bilateral pulmonary metastatic lesions seen space CT of 05/28/2022. For example a 1.5 cm nodule in the left lower lobe (104/8) previously measured approximately 1.0 cm. There is a small right pleural effusion. No pneumothorax. The central airways are patent. Musculoskeletal: Osteopenia with degenerative changes of the spine. Similar pattern osseous metastatic disease as CT of 05/28/2022. No acute  osseous pathology. Loss of subcutaneous fat and cachexia. Review of the MIP images confirms the above findings. CT ABDOMEN and PELVIS FINDINGS No intra-abdominal free air or free fluid. Hepatobiliary: The liver is unremarkable. No biliary dilatation. The gallbladder is unremarkable. Pancreas: No acute findings. Spleen: Normal in size without focal abnormality. Adrenals/Urinary Tract: The adrenal glands are unremarkable. Kidneys, visualized ureters, and urinary bladder appear unremarkable. Stomach/Bowel: There is no bowel obstruction or active inflammation. The appendix is normal. Vascular/Lymphatic: Mild aortoiliac atherosclerotic disease. The IVC is unremarkable. No portal venous gas. No adenopathy. Reproductive: The prostate and seminal vesicles are grossly unremarkable. No pelvic mass. Other: None Musculoskeletal: Osteopenia with degenerative changes of the spine. Similar appearance of L2 sclerotic metastatic disease. No acute osseous pathology. Review of the MIP images confirms the above findings. IMPRESSION: 1. No acute intrathoracic, abdominal, or pelvic pathology. No pulmonary artery embolus identified. 2. Overall slight interval increase in the size of bilateral pulmonary metastatic lesions compared to the CT of 05/28/2022. 3. Small right pleural effusion. 4. Similar pattern of osseous metastatic disease as CT of 05/28/2022. 5. No definite evidence of metastasis in the abdomen and pelvis. 6.  Aortic Atherosclerosis (ICD10-I70.0). Electronically Signed   By: Elgie Collard M.D.   On: 07/15/2022 17:19   CT Head Wo Contrast  Result Date: 07/15/2022 CLINICAL DATA:  Weakness and tachycardia. 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 EXAM: CT HEAD WITHOUT CONTRAST TECHNIQUE: Contiguous axial images were obtained from the base of the skull through the vertex without intravenous contrast. RADIATION DOSE REDUCTION: This exam was performed according to the departmental dose-optimization program which  includes automated exposure control, adjustment of the mA and/or kV according to patient size and/or use of iterative reconstruction technique. COMPARISON:  MRI head 06/02/2022 and CT head 05/07/2001 FINDINGS: Brain: No intracranial hemorrhage, mass effect, or evidence of acute infarct. No hydrocephalus. No extra-axial fluid collection. Generalized cerebral atrophy. Ill-defined hypoattenuation within the cerebral white matter is nonspecific but consistent with chronic small vessel ischemic disease. Chronic right thalamic infarcts. Vascular: No hyperdense vessel. Intracranial arterial calcification. Skull: No fracture or focal lesion. Sinuses/Orbits: No acute finding. Paranasal sinuses and mastoid air cells are well aerated. Other: None. IMPRESSION: 1. No evidence of acute intracranial abnormality. Electronically Signed   By: Minerva Fester M.D.   On: 07/15/2022 17:05   DG Chest Port 1 View  Result Date: 07/15/2022 CLINICAL DATA:  Weakness since Monday, SVT, cancer patient, smoker EXAM: PORTABLE CHEST 1 VIEW COMPARISON:  Portable exam 1527 hours compared to 06/25/2022 FINDINGS: External pacing leads. Normal heart size, mediastinal contours, and pulmonary vascularity. Emphysematous changes with multiple small nodular foci in both lungs consistent with pulmonary metastases. Confluence mass identified mid to lateral RIGHT lung. RIGHT apical pleuroparenchymal opacity unchanged. No new infiltrate or pneumothorax. Trace RIGHT pleural effusion. Bones demineralized. IMPRESSION: Pulmonary metastases greatest lateral mid RIGHT  lung. Underlying COPD changes. Emphysema (ICD10-J43.9). Electronically Signed   By: Ulyses Southward M.D.   On: 07/15/2022 15:38        Scheduled Meds:  amiodarone  200 mg Oral BID   guaiFENesin-dextromethorphan  15 mL Oral Q8H   heparin  5,000 Units Subcutaneous Q8H   ipratropium-albuterol  3 mL Nebulization Q8H   megestrol  400 mg Oral BID   methylPREDNISolone (SOLU-MEDROL) injection  60 mg  Intravenous Q12H   Continuous Infusions:  sodium chloride 100 mL/hr at 07/16/22 0944   amiodarone 30 mg/hr (07/16/22 0943)   azithromycin 500 mg (07/15/22 2122)     LOS: 1 day    Time spent: 45 minutes spent on chart review, discussion with nursing staff, consultants, updating family and interview/physical exam; more than 50% of that time was spent in counseling and/or coordination of care.    Joseph Art, DO [facility] Available via Epic secure chat 7am-7pm After these hours, please refer to coverage provider listed on [email or web address] 07/16/2022, 11:20 AM

## 2022-07-16 NOTE — Plan of Care (Signed)

## 2022-07-16 NOTE — Progress Notes (Signed)
Patient scheduled today for port-a-catheter placement at [facility] Patient now hospitalized at [facility] with anticipated discharge in the next few days. Port-a-catheter placement has been rescheduled for 07/21/22 at 11 am.   Patient will need to arrive to [facility] 07/21/22 at 9 am. Nothing to eat or drink after midnight the day of the procedure. He must have a driver and someone to stay overnight with him post-procedure.   These instructions have been placed on his discharge paperwork. Primary team made aware of new appointment.   I called and spoke with the patient's daughter Israel and discussed all of this with her. She is unsure if that date/time will work and she was given the number to the [facility] Centralized Scheduling office to reschedule the appointment if needed.  Alwyn Ren, [place] [phone] 07/16/2022, 10:44 AM

## 2022-07-17 ENCOUNTER — Encounter (HOSPITAL_COMMUNITY): Payer: Self-pay

## 2022-07-17 DIAGNOSIS — I471 Supraventricular tachycardia, unspecified: Secondary | ICD-10-CM | POA: Diagnosis not present

## 2022-07-17 DIAGNOSIS — C3490 Malignant neoplasm of unspecified part of unspecified bronchus or lung: Secondary | ICD-10-CM | POA: Diagnosis not present

## 2022-07-17 DIAGNOSIS — E43 Unspecified severe protein-calorie malnutrition: Secondary | ICD-10-CM

## 2022-07-17 DIAGNOSIS — J209 Acute bronchitis, unspecified: Secondary | ICD-10-CM

## 2022-07-17 LAB — BASIC METABOLIC PANEL
Anion gap: 13 (ref 5–15)
BUN: 23 mg/dL — ABNORMAL HIGH (ref 6–20)
CO2: 16 mmol/L — ABNORMAL LOW (ref 22–32)
Calcium: 8 mg/dL — ABNORMAL LOW (ref 8.9–10.3)
Chloride: 98 mmol/L (ref 98–111)
Creatinine, Ser: 0.75 mg/dL (ref 0.61–1.24)
GFR, Estimated: >60 mL/min (ref >60)
Glucose, Bld: 134 mg/dL — ABNORMAL HIGH (ref 70–99)
Potassium: 4.5 mmol/L (ref 3.5–5.1)
Sodium: 127 mmol/L — ABNORMAL LOW (ref 135–145)

## 2022-07-17 MED ORDER — METHYLPREDNISOLONE SODIUM SUCC 40 MG IJ SOLR
40.0000 mg | Freq: Two times a day (BID) | INTRAMUSCULAR | Status: DC
  Administered 2022-07-17 – 2022-07-19 (×4): 40 mg via INTRAVENOUS
  Filled 2022-07-17 (×4): qty 1

## 2022-07-17 MED ORDER — AMIODARONE HCL IN DEXTROSE 360-4.14 MG/200ML-% IV SOLN
60.0000 mg/h | INTRAVENOUS | Status: AC
  Administered 2022-07-17: 60 mg/h via INTRAVENOUS
  Filled 2022-07-17: qty 200

## 2022-07-17 MED ORDER — SODIUM CHLORIDE 0.9 % IV SOLN
25.0000 mg | Freq: Four times a day (QID) | INTRAVENOUS | Status: DC | PRN
  Administered 2022-07-17: 25 mg via INTRAVENOUS
  Filled 2022-07-17: qty 1

## 2022-07-17 MED ORDER — CHLORHEXIDINE GLUCONATE CLOTH 2 % EX PADS
6.0000 | MEDICATED_PAD | Freq: Every day | CUTANEOUS | Status: DC
  Administered 2022-07-17 – 2022-07-23 (×7): 6 via TOPICAL

## 2022-07-17 MED ORDER — SODIUM CHLORIDE 0.9 % IV SOLN: INTRAVENOUS | Status: DC

## 2022-07-17 MED ORDER — IPRATROPIUM-ALBUTEROL 0.5-2.5 (3) MG/3ML IN SOLN
3.0000 mL | Freq: Three times a day (TID) | RESPIRATORY_TRACT | Status: DC
  Administered 2022-07-18 – 2022-07-22 (×9): 3 mL via RESPIRATORY_TRACT
  Filled 2022-07-17 (×11): qty 3

## 2022-07-17 MED ORDER — SPIRONOLACTONE 12.5 MG HALF TABLET: 12.5000 mg | ORAL_TABLET | Freq: Every day | ORAL | Status: DC

## 2022-07-17 MED ORDER — PROCHLORPERAZINE EDISYLATE 10 MG/2ML IJ SOLN
10.0000 mg | Freq: Four times a day (QID) | INTRAMUSCULAR | Status: DC | PRN
  Administered 2022-07-17 (×3): 10 mg via INTRAVENOUS
  Filled 2022-07-17 (×3): qty 2

## 2022-07-17 MED ORDER — SACUBITRIL-VALSARTAN 49-51 MG PO TABS
1.0000 | ORAL_TABLET | Freq: Two times a day (BID) | ORAL | Status: DC
  Administered 2022-07-17 – 2022-07-23 (×11): 1 via ORAL
  Filled 2022-07-17 (×15): qty 1

## 2022-07-17 MED ORDER — AMIODARONE HCL IN DEXTROSE 360-4.14 MG/200ML-% IV SOLN
30.0000 mg/h | INTRAVENOUS | Status: DC
  Administered 2022-07-17 – 2022-07-21 (×8): 30 mg/h via INTRAVENOUS
  Filled 2022-07-17 (×8): qty 200

## 2022-07-17 NOTE — Progress Notes (Signed)
Rounding Note    Patient Name: [name] Stone Date of Encounter: 07/17/2022  [facility] Cardiologist: Howell Rucks  Subjective   Some N/V overnight.   Inpatient Medications    Scheduled Meds:  (feeding supplement) PROSource Plus  30 mL Oral TID with meals   amiodarone  200 mg Oral BID   Chlorhexidine Gluconate Cloth  6 each Topical Daily   heparin  5,000 Units Subcutaneous Q8H   ipratropium-albuterol  3 mL Nebulization QID   lactose free nutrition  237 mL Oral TID BM   losartan  25 mg Oral Daily   megestrol  400 mg Oral BID   metoprolol succinate  25 mg Oral Daily   multivitamin-lutein  1 capsule Oral Daily   predniSONE  40 mg Oral Q breakfast   Continuous Infusions:  azithromycin 500 mg (07/16/22 2059)   PRN Meds: acetaminophen **OR** acetaminophen, hydrALAZINE, ipratropium-albuterol, labetalol, ondansetron **OR** ondansetron (ZOFRAN) IV, polyethylene glycol, prochlorperazine   Vital Signs    Vitals:   07/17/22 0400 07/17/22 0500 07/17/22 0506 07/17/22 0600  BP: (!) 172/101 (!) 184/104  (!) 167/97  Pulse: 98 100  (!) 101  Resp: (!) 21 20  (!) 22  Temp:   98.5 F (36.9 C)   TempSrc:   Oral   SpO2: 95% 94%  94%  Weight:   48.7 kg   Height:        Intake/Output Summary (Last 24 hours) at 07/17/2022 0805 Last data filed at 07/17/2022 0600 Gross per 24 hour  Intake 1340.68 ml  Output 400 ml  Net 940.68 ml      07/17/2022    5:06 AM 07/16/2022    4:53 AM 07/15/2022    8:31 PM  Last 3 Weights  Weight (lbs) 107 lb 5.8 oz 108 lb 14.5 oz 102 lb 1.2 oz  Weight (kg) 48.7 kg 49.4 kg 46.3 kg      Telemetry    SR, brief SVT - Personally Reviewed  ECG    N/a - Personally Reviewed  Physical Exam   GEN: No acute distress.   Neck: No JVD Cardiac: RRR, no murmurs, rubs, or gallops.  Respiratory: bilateral expiratory wheezing.  GI: Soft, nontender, non-distended  MS: No edema; No deformity. Neuro:  Nonfocal  Psych: Normal affect   Labs    High  Sensitivity Troponin:   Recent Labs  Lab 07/15/22 1506 07/15/22 1737  TROPONINIHS 54* 54*     Chemistry Recent Labs  Lab 07/15/22 1506 07/15/22 1521 07/16/22 0520 07/17/22 0357  NA 120* 123* 124* 127*  K 3.1* 4.1 4.3 4.5  CL 87* 90* 98 98  CO2 20*  --  19* 16*  GLUCOSE 116* 126* 135* 134*  BUN 29* 34* 21* 23*  CREATININE 1.23 1.00 0.75 0.75  CALCIUM 7.8*  --  7.7* 8.0*  MG 1.7  --  2.2  --   GFRNONAA >60  --  >60 >60  ANIONGAP 13  --  7 13    Lipids No results for input(s): "CHOL", "TRIG", "HDL", "LABVLDL", "LDLCALC", "CHOLHDL" in the last 168 hours.  Hematology Recent Labs  Lab 07/15/22 1506 07/15/22 1521 07/16/22 0520  WBC 11.5*  --  7.8  RBC 3.72*  --  3.33*  HGB 11.7* 11.6* 10.5*  HCT 34.4* 34.0* 30.4*  MCV 92.5  --  91.3  MCH 31.5  --  31.5  MCHC 34.0  --  34.5  RDW 14.8  --  15.1  PLT 178  --  149*   Thyroid  Recent Labs  Lab 07/15/22 1737  TSH 1.387    BNPNo results for input(s): "BNP", "PROBNP" in the last 168 hours.  DDimer  Recent Labs  Lab 07/15/22 1541  DDIMER 5.27*     Radiology    ECHOCARDIOGRAM COMPLETE  Result Date: 07/16/2022    ECHOCARDIOGRAM REPORT   Patient Name:   [name] Stone Date of Exam: 07/16/2022 Medical Rec #:  [id number]     Height:       66.0 in Accession #:    [id number]    Weight:       108.9 lb Date of Birth:  [birth year]     BSA:          1.545 m Patient Age:    61 years      BP:           153/90 mmHg Patient Gender: M             HR:           99 bpm. [facility] Procedure: 2D Echo, Cardiac Doppler and Color Doppler Indications:    SVT (supraventricular tachycardia) [[id number]]  History:        Patient has no prior history of Echocardiogram examinations.                 Cancer, Arrythmias:Tachycardia; Risk Factors:Current Smoker.  Sonographer:    Aron Baba Referring Phys: [id number] Onnie Boer  Sonographer Comments: Image acquisition challenging due to patient body habitus. IMPRESSIONS  1. Left ventricular  ejection fraction, by estimation, is 40 to 45%. The left ventricle has mildly decreased function. The left ventricle demonstrates global hypokinesis. Left ventricular diastolic parameters are indeterminate.  2. Right ventricular systolic function is normal. The right ventricular size is normal. There is moderately elevated pulmonary artery systolic pressure.  3. The mitral valve was not well visualized. No evidence of mitral valve regurgitation. No evidence of mitral stenosis.  4. The tricuspid valve is abnormal. Tricuspid valve regurgitation is moderate.  5. The aortic valve was not well visualized. Aortic valve regurgitation is not visualized. No aortic stenosis is present.  6. Pulmonic valve regurgitation not able to assess. not able to assess pulmonic stenosis.  7. The inferior vena cava is dilated in size with >50% respiratory variability, suggesting right atrial pressure of 8 mmHg.  8. Technically difficult study FINDINGS  Left Ventricle: Left ventricular ejection fraction, by estimation, is 40 to 45%. The left ventricle has mildly decreased function. The left ventricle demonstrates global hypokinesis. The left ventricular internal cavity size was normal in size. There is  no left ventricular hypertrophy. Left ventricular diastolic parameters are indeterminate. Right Ventricle: The right ventricular size is normal. Right vetricular wall thickness was not well visualized. Right ventricular systolic function is normal. There is moderately elevated pulmonary artery systolic pressure. The tricuspid regurgitant velocity is 3.18 m/s, and with an assumed right atrial pressure of 8 mmHg, the estimated right ventricular systolic pressure is 48.4 mmHg. Left Atrium: Left atrial size was not well visualized. Right Atrium: Right atrial size was not well visualized. Pericardium: There is no evidence of pericardial effusion. Mitral Valve: The mitral valve was not well visualized. No evidence of mitral valve regurgitation. No  evidence of mitral valve stenosis. Tricuspid Valve: The tricuspid valve is abnormal. Tricuspid valve regurgitation is moderate . No evidence of tricuspid stenosis. Aortic Valve: The aortic valve was not well visualized. Aortic valve regurgitation is not visualized.  No aortic stenosis is present. Aortic valve mean gradient measures 2.4 mmHg. Aortic valve peak gradient measures 4.6 mmHg. Aortic valve area, by VTI measures 1.74 cm. Pulmonic Valve: The pulmonic valve was not well visualized. Pulmonic valve regurgitation not able to assess. Not able to assess pulmonic stenosis. Aorta: The aortic root is normal in size and structure. Venous: The inferior vena cava is dilated in size with greater than 50% respiratory variability, suggesting right atrial pressure of 8 mmHg. IAS/Shunts: No atrial level shunt detected by color flow Doppler.  LEFT VENTRICLE PLAX 2D LVIDd:         4.10 cm     Diastology LVIDs:         3.30 cm     LV e' medial:    6.31 cm/s LV PW:         1.00 cm     LV E/e' medial:  12.3 LV IVS:        0.70 cm     LV e' lateral:   11.90 cm/s LVOT diam:     1.90 cm     LV E/e' lateral: 6.5 LV SV:         32 LV SV Index:   21 LVOT Area:     2.84 cm  LV Volumes (MOD) LV vol d, MOD A2C: 72.2 ml LV vol d, MOD A4C: 81.9 ml LV vol s, MOD A2C: 42.1 ml LV vol s, MOD A4C: 42.3 ml LV SV MOD A2C:     30.1 ml LV SV MOD A4C:     81.9 ml LV SV MOD BP:      33.8 ml RIGHT VENTRICLE RV S prime:     15.10 cm/s TAPSE (M-mode): 2.7 cm LEFT ATRIUM           Index        RIGHT ATRIUM           Index LA diam:      4.40 cm 2.85 cm/m   RA Area:     11.40 cm LA Vol (A4C): 99.7 ml 64.55 ml/m  RA Volume:   23.70 ml  15.34 ml/m  AORTIC VALVE AV Area (Vmax):    1.83 cm AV Area (Vmean):   1.82 cm AV Area (VTI):     1.74 cm AV Vmax:           107.08 cm/s AV Vmean:          72.611 cm/s AV VTI:            0.186 m AV Peak Grad:      4.6 mmHg AV Mean Grad:      2.4 mmHg LVOT Vmax:         69.00 cm/s LVOT Vmean:        46.700 cm/s LVOT  VTI:          0.114 m LVOT/AV VTI ratio: 0.61  AORTA Ao Root diam: 3.80 cm MITRAL VALVE               TRICUSPID VALVE MV Area (PHT): 4.17 cm    TR Peak grad:   40.4 mmHg MV Decel Time: 182 msec    TR Vmax:        318.00 cm/s MV E velocity: 77.60 cm/s MV A velocity: 51.40 cm/s  SHUNTS MV E/A ratio:  1.51        Systemic VTI:  0.11 m  Systemic Diam: 1.90 cm Dina Rich MD Electronically signed by Dina Rich MD Signature Date/Time: 07/16/2022/12:08:24 PM    Final    CT Angio Chest PE W and/or Wo Contrast  Result Date: 07/15/2022 CLINICAL DATA:  Abdominal trauma. Concern for pulmonary embolism. Lung cancer. EXAM: CT ANGIOGRAPHY CHEST CT ABDOMEN AND PELVIS WITH CONTRAST TECHNIQUE: Multidetector CT imaging of the chest was performed using the standard protocol during bolus administration of intravenous contrast. Multiplanar CT image reconstructions and MIPs were obtained to evaluate the vascular anatomy. Multidetector CT imaging of the abdomen and pelvis was performed using the standard protocol during bolus administration of intravenous contrast. RADIATION DOSE REDUCTION: This exam was performed according to the departmental dose-optimization program which includes automated exposure control, adjustment of the mA and/or kV according to patient size and/or use of iterative reconstruction technique. CONTRAST:  OMNIPAQUE IOHEXOL 350 MG/ML SOLN COMPARISON:  Chest radiograph dated 07/15/2022. PET CT dated 06/04/2022.Chest CT dated 05/28/2022. FINDINGS: Evaluation of this exam is limited due to respiratory motion artifact. CTA CHEST FINDINGS Cardiovascular: There is no cardiomegaly or pericardial effusion. Mild atherosclerotic calcification of the thoracic aorta. No aneurysmal dilatation or dissection. No pulmonary artery embolus identified. Mediastinum/Nodes: Right hilar and subcarinal adenopathy relatively similar to the CT of 05/28/2022. The esophagus is grossly unremarkable. No  mediastinal fluid collection. Lungs/Pleura: Overall slight interval increase in the size of bilateral pulmonary metastatic lesions seen space CT of 05/28/2022. For example a 1.5 cm nodule in the left lower lobe (104/8) previously measured approximately 1.0 cm. There is a small right pleural effusion. No pneumothorax. The central airways are patent. Musculoskeletal: Osteopenia with degenerative changes of the spine. Similar pattern osseous metastatic disease as CT of 05/28/2022. No acute osseous pathology. Loss of subcutaneous fat and cachexia. Review of the MIP images confirms the above findings. CT ABDOMEN and PELVIS FINDINGS No intra-abdominal free air or free fluid. Hepatobiliary: The liver is unremarkable. No biliary dilatation. The gallbladder is unremarkable. Pancreas: No acute findings. Spleen: Normal in size without focal abnormality. Adrenals/Urinary Tract: The adrenal glands are unremarkable. Kidneys, visualized ureters, and urinary bladder appear unremarkable. Stomach/Bowel: There is no bowel obstruction or active inflammation. The appendix is normal. Vascular/Lymphatic: Mild aortoiliac atherosclerotic disease. The IVC is unremarkable. No portal venous gas. No adenopathy. Reproductive: The prostate and seminal vesicles are grossly unremarkable. No pelvic mass. Other: None Musculoskeletal: Osteopenia with degenerative changes of the spine. Similar appearance of L2 sclerotic metastatic disease. No acute osseous pathology. Review of the MIP images confirms the above findings. IMPRESSION: 1. No acute intrathoracic, abdominal, or pelvic pathology. No pulmonary artery embolus identified. 2. Overall slight interval increase in the size of bilateral pulmonary metastatic lesions compared to the CT of 05/28/2022. 3. Small right pleural effusion. 4. Similar pattern of osseous metastatic disease as CT of 05/28/2022. 5. No definite evidence of metastasis in the abdomen and pelvis. 6.  Aortic Atherosclerosis  (ICD10-I70.0). Electronically Signed   By: Elgie Collard M.D.   On: 07/15/2022 17:19   CT ABDOMEN PELVIS W CONTRAST  Result Date: 07/15/2022 CLINICAL DATA:  Abdominal trauma. Concern for pulmonary embolism. Lung cancer. EXAM: CT ANGIOGRAPHY CHEST CT ABDOMEN AND PELVIS WITH CONTRAST TECHNIQUE: Multidetector CT imaging of the chest was performed using the standard protocol during bolus administration of intravenous contrast. Multiplanar CT image reconstructions and MIPs were obtained to evaluate the vascular anatomy. Multidetector CT imaging of the abdomen and pelvis was performed using the standard protocol during bolus administration of intravenous contrast. RADIATION DOSE REDUCTION: This  exam was performed according to the departmental dose-optimization program which includes automated exposure control, adjustment of the mA and/or kV according to patient size and/or use of iterative reconstruction technique. CONTRAST:  OMNIPAQUE IOHEXOL 350 MG/ML SOLN COMPARISON:  Chest radiograph dated 07/15/2022. PET CT dated 06/04/2022.Chest CT dated 05/28/2022. FINDINGS: Evaluation of this exam is limited due to respiratory motion artifact. CTA CHEST FINDINGS Cardiovascular: There is no cardiomegaly or pericardial effusion. Mild atherosclerotic calcification of the thoracic aorta. No aneurysmal dilatation or dissection. No pulmonary artery embolus identified. Mediastinum/Nodes: Right hilar and subcarinal adenopathy relatively similar to the CT of 05/28/2022. The esophagus is grossly unremarkable. No mediastinal fluid collection. Lungs/Pleura: Overall slight interval increase in the size of bilateral pulmonary metastatic lesions seen space CT of 05/28/2022. For example a 1.5 cm nodule in the left lower lobe (104/8) previously measured approximately 1.0 cm. There is a small right pleural effusion. No pneumothorax. The central airways are patent. Musculoskeletal: Osteopenia with degenerative changes of the spine.  Similar pattern osseous metastatic disease as CT of 05/28/2022. No acute osseous pathology. Loss of subcutaneous fat and cachexia. Review of the MIP images confirms the above findings. CT ABDOMEN and PELVIS FINDINGS No intra-abdominal free air or free fluid. Hepatobiliary: The liver is unremarkable. No biliary dilatation. The gallbladder is unremarkable. Pancreas: No acute findings. Spleen: Normal in size without focal abnormality. Adrenals/Urinary Tract: The adrenal glands are unremarkable. Kidneys, visualized ureters, and urinary bladder appear unremarkable. Stomach/Bowel: There is no bowel obstruction or active inflammation. The appendix is normal. Vascular/Lymphatic: Mild aortoiliac atherosclerotic disease. The IVC is unremarkable. No portal venous gas. No adenopathy. Reproductive: The prostate and seminal vesicles are grossly unremarkable. No pelvic mass. Other: None Musculoskeletal: Osteopenia with degenerative changes of the spine. Similar appearance of L2 sclerotic metastatic disease. No acute osseous pathology. Review of the MIP images confirms the above findings. IMPRESSION: 1. No acute intrathoracic, abdominal, or pelvic pathology. No pulmonary artery embolus identified. 2. Overall slight interval increase in the size of bilateral pulmonary metastatic lesions compared to the CT of 05/28/2022. 3. Small right pleural effusion. 4. Similar pattern of osseous metastatic disease as CT of 05/28/2022. 5. No definite evidence of metastasis in the abdomen and pelvis. 6.  Aortic Atherosclerosis (ICD10-I70.0). Electronically Signed   By: Elgie Collard M.D.   On: 07/15/2022 17:19   CT Head Wo Contrast  Result Date: 07/15/2022 CLINICAL DATA:  Weakness and tachycardia. 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 EXAM: CT HEAD WITHOUT CONTRAST TECHNIQUE: Contiguous axial images were obtained from the base of the skull through the vertex without intravenous contrast. RADIATION DOSE REDUCTION: This exam was  performed according to the departmental dose-optimization program which includes automated exposure control, adjustment of the mA and/or kV according to patient size and/or use of iterative reconstruction technique. COMPARISON:  MRI head 06/02/2022 and CT head 05/07/2001 FINDINGS: Brain: No intracranial hemorrhage, mass effect, or evidence of acute infarct. No hydrocephalus. No extra-axial fluid collection. Generalized cerebral atrophy. Ill-defined hypoattenuation within the cerebral white matter is nonspecific but consistent with chronic small vessel ischemic disease. Chronic right thalamic infarcts. Vascular: No hyperdense vessel. Intracranial arterial calcification. Skull: No fracture or focal lesion. Sinuses/Orbits: No acute finding. Paranasal sinuses and mastoid air cells are well aerated. Other: None. IMPRESSION: 1. No evidence of acute intracranial abnormality. Electronically Signed   By: Minerva Fester M.D.   On: 07/15/2022 17:05   DG Chest Port 1 View  Result Date: 07/15/2022 CLINICAL DATA:  Weakness since Monday, SVT, cancer patient,  smoker EXAM: PORTABLE CHEST 1 VIEW COMPARISON:  Portable exam 1527 hours compared to 06/25/2022 FINDINGS: External pacing leads. Normal heart size, mediastinal contours, and pulmonary vascularity. Emphysematous changes with multiple small nodular foci in both lungs consistent with pulmonary metastases. Confluence mass identified mid to lateral RIGHT lung. RIGHT apical pleuroparenchymal opacity unchanged. No new infiltrate or pneumothorax. Trace RIGHT pleural effusion. Bones demineralized. IMPRESSION: Pulmonary metastases greatest lateral mid RIGHT lung. Underlying COPD changes. Emphysema (ICD10-J43.9). Electronically Signed   By: Ulyses Southward M.D.   On: 07/15/2022 15:38    Cardiac Studies    Patient Profile     [name] Stone is a 62 y.o. male with a hx of metastatic lung cancer who is being seen 07/15/2022 for the evaluation of tachycardia at the request of Dr  Angelena Sole.   Assessment & Plan    1.SVT - narrow complex regular tach 220 on presentation. Did not break with adenososine.   At slower rates evidence of  a long RP tachycardia consistent with atach - K 3.1 (though normal by istat), Mg 1.7 on admission - soft bp's initially,  started on IV amiodarone by ER staff - SVT in setting of low K, Mg, metastatic cancer, possible bronchitis elevated lactic acid.    -stable in SR. Transitioned from IV amio to oral yesterday, plans for 200mg  bid x 3 weeks then 200mg  daily. May consider coming off in the future, perhaps with normal bp's over time could be controlled with just an av nodal agent.   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 Transition to entresto 24/26mg  bid today, add aldactone 12.5mg  daily. Admitted hypovolemic with poor oral intake, hold on SGLT2i for now - metastatiac CA and just mild LV dysfunction, no plans for ishcemic testing at this time. Repeat echo 3-6 months      3. Metastatic lung CA - per primary team   4. Hyponatremia - per primary team - improving with IVFs    For questions or updates, please contact [facility] Please consult [email or web address] for contact info under        Signed, Dina Rich, MD  07/17/2022, 8:05 AM

## 2022-07-17 NOTE — Consult Note (Signed)
[facility] Consultation Oncology  Name: [name] Stone      MRN: [id number]    Location: IC02/IC02-01  Date: 07/17/2022 Time:4:21 PM   REFERRING PHYSICIAN: Dr. Sherryll Burger  REASON FOR CONSULT: Metastatic lung adenocarcinoma    HISTORY OF PRESENT ILLNESS: [name] Stone is a 62 year old pleasant male known to me from office visits.  He was recently diagnosed with metastatic lung adenocarcinoma with bilateral lung nodules, right pleural nodularity, right supraclavicular, axillary, hilar, infrahilar adenopathy and bone lesions.  He received radiation therapy to the lower back.  He was supposed to have port placed next week and started on palliative chemotherapy.  He was well until 07/14/2022 and fell that night.  He was brought to the ER on 07/15/2022 with generalized weakness and repeated falls.  He was found to be in SVT in the ER and is currently receiving amiodarone.  He was also found to have a mildly decreased ejection fraction on an echo with LVEF 40-45%.  Per daughter, he was talking slightly better yesterday.  He was given labetalol today which has increased his heart rate again and he is back on amiodarone drip.  PAST MEDICAL HISTORY:   Past Medical History:  Diagnosis Date   Cancer (HCC)    Tobacco abuse     ALLERGIES: Not on File    MEDICATIONS: I have reviewed the patient's current medications.     PAST SURGICAL HISTORY History reviewed. No pertinent surgical history.  FAMILY HISTORY: History reviewed. No pertinent family history.  SOCIAL HISTORY:  reports that he has been smoking cigarettes. He has been smoking an average of 1 pack per day. He does not have any smokeless tobacco history on file. No history on file for alcohol use and drug use.  PERFORMANCE STATUS: The patient's performance status is 2 - Symptomatic, <50% confined to bed  PHYSICAL EXAM: Most Recent Vital Signs: Blood pressure 131/76, pulse (!) 103, temperature 98.1 F (36.7 C), temperature source Oral, resp.  rate 19, height 5\' 6"  (1.676 m), weight 107 lb 5.8 oz (48.7 kg), SpO2 94 %. BP 131/76   Pulse (!) 103   Temp 98.1 F (36.7 C) (Oral)   Resp 19   Ht 5\' 6"  (1.676 m)   Wt 107 lb 5.8 oz (48.7 kg)   SpO2 94%   BMI 17.33 kg/m   HEENT: No thrush noted. Heart: S1-S2 regular rate and rhythm. Extremities: No edema or cyanosis.  Muscle strength is 3 out of 5.  LABORATORY DATA:  Results for orders placed or performed during the hospital encounter of 07/15/22 (from the past 48 hour(s))  POC occult blood, ED Provider will collect     Status: None   Collection Time: 07/15/22  4:31 PM  Result Value Ref Range   Fecal Occult Blood Negative   Lactic acid, plasma     Status: Abnormal   Collection Time: 07/15/22  5:37 PM  Result Value Ref Range   Lactic Acid, Venous 4.5 (HH) 0.5 - 1.9 mmol/L    Comment: CRITICAL VALUE NOTED.  VALUE IS CONSISTENT WITH PREVIOUSLY REPORTED AND CALLED VALUE. Performed at [facility], [address], [place], [place] [address]   Troponin I (High Sensitivity)     Status: Abnormal   Collection Time: 07/15/22  5:37 PM  Result Value Ref Range   Troponin I (High Sensitivity) 54 (H) <18 ng/L    Comment: (NOTE) Elevated high sensitivity troponin I (hsTnI) values and significant  changes across serial measurements may suggest ACS but  many other  chronic and acute conditions are known to elevate hsTnI results.  Refer to the "Links" section for chest pain algorithms and additional  guidance. Performed at [facility], [address] [place], [place] [address]   TSH     Status: None   Collection Time: 07/15/22  5:37 PM  Result Value Ref Range   TSH 1.387 0.350 - 4.500 uIU/mL    Comment: Performed by a 3rd Generation assay with a functional sensitivity of <=0.01 uIU/mL. Performed at [facility], [address], [place], [place] [address]   Lactic acid, plasma     Status: Abnormal   Collection Time: 07/15/22  7:57 PM  Result Value Ref Range   Lactic Acid, Venous 2.6  (HH) 0.5 - 1.9 mmol/L    Comment: CRITICAL VALUE NOTED.  VALUE IS CONSISTENT WITH PREVIOUSLY REPORTED AND CALLED VALUE. Performed at [facility], [address], [place], [place] [address]   HIV Antibody (routine testing w rflx)     Status: None   Collection Time: 07/15/22  7:57 PM  Result Value Ref Range   HIV Screen 4th Generation wRfx Non Reactive Non Reactive    Comment: Performed at [facility], [address], [place], [place] [address]  Urinalysis, Routine w reflex microscopic -Urine, Clean Catch     Status: Abnormal   Collection Time: 07/15/22  8:14 PM  Result Value Ref Range   Color, Urine AMBER (A) YELLOW    Comment: BIOCHEMICALS MAY BE AFFECTED BY COLOR   APPearance CLEAR CLEAR   Specific Gravity, Urine >1.046 (H) 1.005 - 1.030   pH 5.0 5.0 - 8.0   Glucose, UA NEGATIVE NEGATIVE mg/dL   Hgb urine dipstick NEGATIVE NEGATIVE   Bilirubin Urine NEGATIVE NEGATIVE   Ketones, ur NEGATIVE NEGATIVE mg/dL   Protein, ur 30 (A) NEGATIVE mg/dL   Nitrite NEGATIVE NEGATIVE   Leukocytes,Ua NEGATIVE NEGATIVE   RBC / HPF 0-5 0 - 5 RBC/hpf   WBC, UA 0-5 0 - 5 WBC/hpf   Bacteria, UA RARE (A) NONE SEEN   Squamous Epithelial / HPF 0-5 0 - 5 /HPF   Mucus PRESENT    Hyaline Casts, UA PRESENT     Comment: Performed at [facility], [address] [place], [place] [address]  MRSA Next Gen by PCR, Nasal     Status: None   Collection Time: 07/15/22  8:35 PM   Specimen: Nasal Mucosa; Nasal Swab  Result Value Ref Range   MRSA by PCR Next Gen NOT DETECTED NOT DETECTED    Comment: (NOTE) The GeneXpert MRSA Assay (FDA approved for NASAL specimens only), is one component of a comprehensive MRSA colonization surveillance program. It is not intended to diagnose MRSA infection nor to guide or monitor treatment for MRSA infections. Test performance is not FDA approved in patients less than 59 years old. Performed at [facility], [address], [place], [place] [address]   Basic metabolic  panel     Status: Abnormal   Collection Time: 07/16/22  5:20 AM  Result Value Ref Range   Sodium 124 (L) 135 - 145 mmol/L   Potassium 4.3 3.5 - 5.1 mmol/L   Chloride 98 98 - 111 mmol/L   CO2 19 (L) 22 - 32 mmol/L   Glucose, Bld 135 (H) 70 - 99 mg/dL    Comment: Glucose reference range applies only to samples taken after fasting for at least 8 hours.   BUN 21 (H) 6 - 20 mg/dL  Creatinine, Ser 0.75 0.61 - 1.24 mg/dL   Calcium 7.7 (L) 8.9 - 10.3 mg/dL   GFR, Estimated >16 >10 mL/min    Comment: (NOTE) Calculated using the CKD-EPI Creatinine Equation (2021)    Anion gap 7 5 - 15    Comment: Performed at [facility], [address], [place], [place] [address]  CBC     Status: Abnormal   Collection Time: 07/16/22  5:20 AM  Result Value Ref Range   WBC 7.8 4.0 - 10.5 K/uL   RBC 3.33 (L) 4.22 - 5.81 MIL/uL   Hemoglobin 10.5 (L) 13.0 - 17.0 g/dL   HCT 40.9 (L) 81.1 - 91.4 %   MCV 91.3 80.0 - 100.0 fL   MCH 31.5 26.0 - 34.0 pg   MCHC 34.5 30.0 - 36.0 g/dL   RDW 78.2 95.6 - 21.3 %   Platelets 149 (L) 150 - 400 K/uL   nRBC 0.0 0.0 - 0.2 %    Comment: Performed at [facility], [address] [place], [place] [address]  Magnesium     Status: None   Collection Time: 07/16/22  5:20 AM  Result Value Ref Range   Magnesium 2.2 1.7 - 2.4 mg/dL    Comment: Performed at [facility], [address], [place], [place] [address]  Sodium, urine, random     Status: None   Collection Time: 07/16/22 12:31 PM  Result Value Ref Range   Sodium, Ur <10 mmol/L    Comment: Performed at [facility], [address], [place], [place] [address]  Basic metabolic panel     Status: Abnormal   Collection Time: 07/17/22  3:57 AM  Result Value Ref Range   Sodium 127 (L) 135 - 145 mmol/L   Potassium 4.5 3.5 - 5.1 mmol/L   Chloride 98 98 - 111 mmol/L   CO2 16 (L) 22 - 32 mmol/L   Glucose, Bld 134 (H) 70 - 99 mg/dL    Comment: Glucose reference range applies only to samples taken after fasting for at least 8  hours.   BUN 23 (H) 6 - 20 mg/dL   Creatinine, Ser 4.13 0.61 - 1.24 mg/dL   Calcium 8.0 (L) 8.9 - 10.3 mg/dL   GFR, Estimated >24 >40 mL/min    Comment: (NOTE) Calculated using the CKD-EPI Creatinine Equation (2021)    Anion gap 13 5 - 15    Comment: Performed at [facility], [address], [place], [place] [address]      RADIOGRAPHY: ECHOCARDIOGRAM COMPLETE  Result Date: 07/16/2022    ECHOCARDIOGRAM REPORT   Patient Name:   MARLYN TEE Date of Exam: 07/16/2022 Medical Rec #:  [id number]     Height:       66.0 in Accession #:    [id number]    Weight:       108.9 lb Date of Birth:  1960-04-08     BSA:          1.545 m Patient Age:    61 years      BP:           153/90 mmHg Patient Gender: M             HR:           99 bpm. [facility] Procedure: 2D Echo, Cardiac Doppler and Color Doppler Indications:    SVT (supraventricular tachycardia) [[id number]]  History:        Patient has no prior history of Echocardiogram  examinations.                 Cancer, Arrythmias:Tachycardia; Risk Factors:Current Smoker.  Sonographer:    Aron Baba Referring Phys: [id number] Onnie Boer  Sonographer Comments: Image acquisition challenging due to patient body habitus. IMPRESSIONS  1. Left ventricular ejection fraction, by estimation, is 40 to 45%. The left ventricle has mildly decreased function. The left ventricle demonstrates global hypokinesis. Left ventricular diastolic parameters are indeterminate.  2. Right ventricular systolic function is normal. The right ventricular size is normal. There is moderately elevated pulmonary artery systolic pressure.  3. The mitral valve was not well visualized. No evidence of mitral valve regurgitation. No evidence of mitral stenosis.  4. The tricuspid valve is abnormal. Tricuspid valve regurgitation is moderate.  5. The aortic valve was not well visualized. Aortic valve regurgitation is not visualized. No aortic stenosis is present.  6. Pulmonic valve regurgitation  not able to assess. not able to assess pulmonic stenosis.  7. The inferior vena cava is dilated in size with >50% respiratory variability, suggesting right atrial pressure of 8 mmHg.  8. Technically difficult study FINDINGS  Left Ventricle: Left ventricular ejection fraction, by estimation, is 40 to 45%. The left ventricle has mildly decreased function. The left ventricle demonstrates global hypokinesis. The left ventricular internal cavity size was normal in size. There is  no left ventricular hypertrophy. Left ventricular diastolic parameters are indeterminate. Right Ventricle: The right ventricular size is normal. Right vetricular wall thickness was not well visualized. Right ventricular systolic function is normal. There is moderately elevated pulmonary artery systolic pressure. The tricuspid regurgitant velocity is 3.18 m/s, and with an assumed right atrial pressure of 8 mmHg, the estimated right ventricular systolic pressure is 48.4 mmHg. Left Atrium: Left atrial size was not well visualized. Right Atrium: Right atrial size was not well visualized. Pericardium: There is no evidence of pericardial effusion. Mitral Valve: The mitral valve was not well visualized. No evidence of mitral valve regurgitation. No evidence of mitral valve stenosis. Tricuspid Valve: The tricuspid valve is abnormal. Tricuspid valve regurgitation is moderate . No evidence of tricuspid stenosis. Aortic Valve: The aortic valve was not well visualized. Aortic valve regurgitation is not visualized. No aortic stenosis is present. Aortic valve mean gradient measures 2.4 mmHg. Aortic valve peak gradient measures 4.6 mmHg. Aortic valve area, by VTI measures 1.74 cm. Pulmonic Valve: The pulmonic valve was not well visualized. Pulmonic valve regurgitation not able to assess. Not able to assess pulmonic stenosis. Aorta: The aortic root is normal in size and structure. Venous: The inferior vena cava is dilated in size with greater than 50%  respiratory variability, suggesting right atrial pressure of 8 mmHg. IAS/Shunts: No atrial level shunt detected by color flow Doppler.  LEFT VENTRICLE PLAX 2D LVIDd:         4.10 cm     Diastology LVIDs:         3.30 cm     LV e' medial:    6.31 cm/s LV PW:         1.00 cm     LV E/e' medial:  12.3 LV IVS:        0.70 cm     LV e' lateral:   11.90 cm/s LVOT diam:     1.90 cm     LV E/e' lateral: 6.5 LV SV:         32 LV SV Index:   21 LVOT Area:     2.84 cm  LV Volumes (MOD) LV vol d, MOD A2C: 72.2 ml LV vol d, MOD A4C: 81.9 ml LV vol s, MOD A2C: 42.1 ml LV vol s, MOD A4C: 42.3 ml LV SV MOD A2C:     30.1 ml LV SV MOD A4C:     81.9 ml LV SV MOD BP:      33.8 ml RIGHT VENTRICLE RV S prime:     15.10 cm/s TAPSE (M-mode): 2.7 cm LEFT ATRIUM           Index        RIGHT ATRIUM           Index LA diam:      4.40 cm 2.85 cm/m   RA Area:     11.40 cm LA Vol (A4C): 99.7 ml 64.55 ml/m  RA Volume:   23.70 ml  15.34 ml/m  AORTIC VALVE AV Area (Vmax):    1.83 cm AV Area (Vmean):   1.82 cm AV Area (VTI):     1.74 cm AV Vmax:           107.08 cm/s AV Vmean:          72.611 cm/s AV VTI:            0.186 m AV Peak Grad:      4.6 mmHg AV Mean Grad:      2.4 mmHg LVOT Vmax:         69.00 cm/s LVOT Vmean:        46.700 cm/s LVOT VTI:          0.114 m LVOT/AV VTI ratio: 0.61  AORTA Ao Root diam: 3.80 cm MITRAL VALVE               TRICUSPID VALVE MV Area (PHT): 4.17 cm    TR Peak grad:   40.4 mmHg MV Decel Time: 182 msec    TR Vmax:        318.00 cm/s MV E velocity: 77.60 cm/s MV A velocity: 51.40 cm/s  SHUNTS MV E/A ratio:  1.51        Systemic VTI:  0.11 m                            Systemic Diam: 1.90 cm Dina Rich MD Electronically signed by Dina Rich MD Signature Date/Time: 07/16/2022/12:08:24 PM    Final    CT Angio Chest PE W and/or Wo Contrast  Result Date: 07/15/2022 CLINICAL DATA:  Abdominal trauma. Concern for pulmonary embolism. Lung cancer. EXAM: CT ANGIOGRAPHY CHEST CT ABDOMEN AND PELVIS WITH CONTRAST  TECHNIQUE: Multidetector CT imaging of the chest was performed using the standard protocol during bolus administration of intravenous contrast. Multiplanar CT image reconstructions and MIPs were obtained to evaluate the vascular anatomy. Multidetector CT imaging of the abdomen and pelvis was performed using the standard protocol during bolus administration of intravenous contrast. RADIATION DOSE REDUCTION: This exam was performed according to the departmental dose-optimization program which includes automated exposure control, adjustment of the mA and/or kV according to patient size and/or use of iterative reconstruction technique. CONTRAST:  OMNIPAQUE IOHEXOL 350 MG/ML SOLN COMPARISON:  Chest radiograph dated 07/15/2022. PET CT dated 06/04/2022.Chest CT dated 05/28/2022. FINDINGS: Evaluation of this exam is limited due to respiratory motion artifact. CTA CHEST FINDINGS Cardiovascular: There is no cardiomegaly or pericardial effusion. Mild atherosclerotic calcification of the thoracic aorta. No aneurysmal dilatation or dissection. No pulmonary artery embolus identified. Mediastinum/Nodes: Right hilar  and subcarinal adenopathy relatively similar to the CT of 05/28/2022. The esophagus is grossly unremarkable. No mediastinal fluid collection. Lungs/Pleura: Overall slight interval increase in the size of bilateral pulmonary metastatic lesions seen space CT of 05/28/2022. For example a 1.5 cm nodule in the left lower lobe (104/8) previously measured approximately 1.0 cm. There is a small right pleural effusion. No pneumothorax. The central airways are patent. Musculoskeletal: Osteopenia with degenerative changes of the spine. Similar pattern osseous metastatic disease as CT of 05/28/2022. No acute osseous pathology. Loss of subcutaneous fat and cachexia. Review of the MIP images confirms the above findings. CT ABDOMEN and PELVIS FINDINGS No intra-abdominal free air or free fluid. Hepatobiliary: The liver is  unremarkable. No biliary dilatation. The gallbladder is unremarkable. Pancreas: No acute findings. Spleen: Normal in size without focal abnormality. Adrenals/Urinary Tract: The adrenal glands are unremarkable. Kidneys, visualized ureters, and urinary bladder appear unremarkable. Stomach/Bowel: There is no bowel obstruction or active inflammation. The appendix is normal. Vascular/Lymphatic: Mild aortoiliac atherosclerotic disease. The IVC is unremarkable. No portal venous gas. No adenopathy. Reproductive: The prostate and seminal vesicles are grossly unremarkable. No pelvic mass. Other: None Musculoskeletal: Osteopenia with degenerative changes of the spine. Similar appearance of L2 sclerotic metastatic disease. No acute osseous pathology. Review of the MIP images confirms the above findings. IMPRESSION: 1. No acute intrathoracic, abdominal, or pelvic pathology. No pulmonary artery embolus identified. 2. Overall slight interval increase in the size of bilateral pulmonary metastatic lesions compared to the CT of 05/28/2022. 3. Small right pleural effusion. 4. Similar pattern of osseous metastatic disease as CT of 05/28/2022. 5. No definite evidence of metastasis in the abdomen and pelvis. 6.  Aortic Atherosclerosis (ICD10-I70.0). Electronically Signed   By: Elgie Collard M.D.   On: 07/15/2022 17:19   CT ABDOMEN PELVIS W CONTRAST  Result Date: 07/15/2022 CLINICAL DATA:  Abdominal trauma. Concern for pulmonary embolism. Lung cancer. EXAM: CT ANGIOGRAPHY CHEST CT ABDOMEN AND PELVIS WITH CONTRAST TECHNIQUE: Multidetector CT imaging of the chest was performed using the standard protocol during bolus administration of intravenous contrast. Multiplanar CT image reconstructions and MIPs were obtained to evaluate the vascular anatomy. Multidetector CT imaging of the abdomen and pelvis was performed using the standard protocol during bolus administration of intravenous contrast. RADIATION DOSE REDUCTION: This exam was  performed according to the departmental dose-optimization program which includes automated exposure control, adjustment of the mA and/or kV according to patient size and/or use of iterative reconstruction technique. CONTRAST:  OMNIPAQUE IOHEXOL 350 MG/ML SOLN COMPARISON:  Chest radiograph dated 07/15/2022. PET CT dated 06/04/2022.Chest CT dated 05/28/2022. FINDINGS: Evaluation of this exam is limited due to respiratory motion artifact. CTA CHEST FINDINGS Cardiovascular: There is no cardiomegaly or pericardial effusion. Mild atherosclerotic calcification of the thoracic aorta. No aneurysmal dilatation or dissection. No pulmonary artery embolus identified. Mediastinum/Nodes: Right hilar and subcarinal adenopathy relatively similar to the CT of 05/28/2022. The esophagus is grossly unremarkable. No mediastinal fluid collection. Lungs/Pleura: Overall slight interval increase in the size of bilateral pulmonary metastatic lesions seen space CT of 05/28/2022. For example a 1.5 cm nodule in the left lower lobe (104/8) previously measured approximately 1.0 cm. There is a small right pleural effusion. No pneumothorax. The central airways are patent. Musculoskeletal: Osteopenia with degenerative changes of the spine. Similar pattern osseous metastatic disease as CT of 05/28/2022. No acute osseous pathology. Loss of subcutaneous fat and cachexia. Review of the MIP images confirms the above findings. CT ABDOMEN and PELVIS FINDINGS No intra-abdominal free air  or free fluid. Hepatobiliary: The liver is unremarkable. No biliary dilatation. The gallbladder is unremarkable. Pancreas: No acute findings. Spleen: Normal in size without focal abnormality. Adrenals/Urinary Tract: The adrenal glands are unremarkable. Kidneys, visualized ureters, and urinary bladder appear unremarkable. Stomach/Bowel: There is no bowel obstruction or active inflammation. The appendix is normal. Vascular/Lymphatic: Mild aortoiliac atherosclerotic  disease. The IVC is unremarkable. No portal venous gas. No adenopathy. Reproductive: The prostate and seminal vesicles are grossly unremarkable. No pelvic mass. Other: None Musculoskeletal: Osteopenia with degenerative changes of the spine. Similar appearance of L2 sclerotic metastatic disease. No acute osseous pathology. Review of the MIP images confirms the above findings. IMPRESSION: 1. No acute intrathoracic, abdominal, or pelvic pathology. No pulmonary artery embolus identified. 2. Overall slight interval increase in the size of bilateral pulmonary metastatic lesions compared to the CT of 05/28/2022. 3. Small right pleural effusion. 4. Similar pattern of osseous metastatic disease as CT of 05/28/2022. 5. No definite evidence of metastasis in the abdomen and pelvis. 6.  Aortic Atherosclerosis (ICD10-I70.0). Electronically Signed   By: Elgie Collard M.D.   On: 07/15/2022 17:19   CT Head Wo Contrast  Result Date: 07/15/2022 CLINICAL DATA:  Weakness and tachycardia. 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 EXAM: CT HEAD WITHOUT CONTRAST TECHNIQUE: Contiguous axial images were obtained from the base of the skull through the vertex without intravenous contrast. RADIATION DOSE REDUCTION: This exam was performed according to the departmental dose-optimization program which includes automated exposure control, adjustment of the mA and/or kV according to patient size and/or use of iterative reconstruction technique. COMPARISON:  MRI head 06/02/2022 and CT head 05/07/2001 FINDINGS: Brain: No intracranial hemorrhage, mass effect, or evidence of acute infarct. No hydrocephalus. No extra-axial fluid collection. Generalized cerebral atrophy. Ill-defined hypoattenuation within the cerebral white matter is nonspecific but consistent with chronic small vessel ischemic disease. Chronic right thalamic infarcts. Vascular: No hyperdense vessel. Intracranial arterial calcification. Skull: No fracture or focal lesion.  Sinuses/Orbits: No acute finding. Paranasal sinuses and mastoid air cells are well aerated. Other: None. IMPRESSION: 1. No evidence of acute intracranial abnormality. Electronically Signed   By: Minerva Fester M.D.   On: 07/15/2022 17:05         ASSESSMENT and PLAN:  1.  Metastatic adenocarcinoma of the lung to the bones: - He is yet to start systemic therapy next week. - If his general condition does not improve in the next few days, I would recommend palliative care/hospice. - Discussed with patient's daughter at bedside.  2.  SVT: - Continue amiodarone per cardiology.  Rate is fairly controlled at this time. - Echo LVEF 40-45%.  3.  Acute bronchitis: - CT angiogram was negative for acute abnormality. - Continue azithromycin and supportive care.  4.  Persistent nausea: - Multifactorial, exacerbated by amiodarone. - Continue Zofran, Compazine and Phenergan as needed.  All questions were answered. The patient knows to call the clinic with any problems, questions or concerns. We can certainly see the patient much sooner if necessary.    Doreatha Massed

## 2022-07-17 NOTE — Progress Notes (Signed)
PROGRESS NOTE    [name] Stone  WUJ:[id number] DOB: September 25, 1960 DOA: 07/15/2022 PCP: Patient, No Pcp Per    Brief Narrative:   [name] Stone is a 62 y.o. male with medical history significant for recent lung cancer diagnosis, tobacco abuse.  Patient was brought to the ED via EMS with reports of generalized weakness, 3 falls over the past 24 hours.  EMS found the patient to be in and out of SVT.  Patient denies palpitation, no chest pain.  Reports some dizziness when standing, and difficulty breathing with exertion.  No lower extremity swelling.  No fevers no chills.  He has chronic cough that has worsened over the past few days, with some rattling. He has had chronic poor oral intake, with weight loss. He was recently diagnosed with metastatic lung cancer, and has been evaluated by Dr. Ellin Saba, he has not started therapy.  Assessment and Plan: * SVT (supraventricular tachycardia) Heart rates up to 180s.  Temporary break with 6 mg and then 12 mg of adenosine, and was back in SVT.  150 mg amiodarone bolus given, currently on amiodarone drip.  Heart rate controlled in the 100s.  No cardiac history.  No chest pain.  No palpitation.  Reports some dizziness, dyspnea on exertion.  Hypokalemia 3.1.  Magnesium 1.7.  Was hypotensive to 70s, with resultant lactic acidosis. -Cardiology ongoing recommendations appreciated and patient on oral amiodarone -Replete potassium. -Echocardiogram with LVEF 40-45% -Lactic acidosis trending down -Okay for transfer to telemetry for continued monitoring of nausea and vomiting to see if oral medications tolerated  Acute bronchitis Dyspnea on exertion, diffuse rhonchi, worsening cough.  Denies diagnosis of COPD.  Ongoing tobacco abuse 1 pack/day.  CTA chest negative for acute abnormality.  O2 sats -DuoNebs as needed and scheduled -Mucolytics as needed -Azithromycin -Continue on oral prednisone  AKI (acute kidney injury) (HCC)-resolved Continue to monitor a.m.  labs  Chronic hyponatremia Likely in the setting of poor oral intake with occasional vomiting, continue to monitor  Metastatic cancer [facility]) Following with Dr. Ellin Saba.  New diagnosis of non-small cell lung cancer.  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  CT chest 05/28/22 showed numerous bilateral lung nodules.  Right brain no metastatic disease, PET scan 3/25 - Innumerable bilateral hypermetabolic lung nodules, lymphadenopathy.   - He is to start chemotherapy next week after port placement by IR (port rescheduled for 5/7 at [facility]).  Intent of treatment being palliative. -Resume Marinol -Appreciate evaluation per oncology as requested by family today    DVT prophylaxis: heparin injection 5,000 Units Start: 07/15/22 2200    Code Status: Full Code Family Communication: Daughter at bedside 5/3  Disposition Plan:  Level of care: Stepdown Status is: Inpatient Remains inpatient appropriate because: needs IV meds    Consultants:  Cards Oncology   Subjective: Patient seen and evaluated this a.m. with daughter at bedside and he is currently somnolent with very little sleep noted overnight and ongoing nausea and vomiting.  Objective: Vitals:   07/17/22 0800 07/17/22 0843 07/17/22 0900 07/17/22 1000  BP: (!) 142/74  (!) 160/84 (!) 197/106  Pulse: (!) 103  (!) 101 (!) 112  Resp: 15  16 (!) 22  Temp:      TempSrc:      SpO2: 94% 96% 95% 95%  Weight:      Height:        Intake/Output Summary (Last 24 hours) at 07/17/2022 1050 Last data filed at 07/17/2022 0600 Gross per 24 hour  Intake 1111.2 ml  Output 400 ml  Net 711.2 ml   Filed Weights   07/15/22 2031 07/16/22 0453 07/17/22 0506  Weight: 46.3 kg 49.4 kg 48.7 kg    Examination:   General: Appearance:    Thin male in no acute distress, somnolent     Lungs:     Rhonchus on right, few expiratory wheezes, respirations unlabored- not on O2  Heart:    Tachycardic.    MS:   All extremities are intact.    Neurologic:    Awake, alert, oriented x 3. No apparent focal neurological           defect.        Data Reviewed: I have personally reviewed following labs and imaging studies  CBC: Recent Labs  Lab 07/15/22 1506 07/15/22 1521 07/16/22 0520  WBC 11.5*  --  7.8  HGB 11.7* 11.6* 10.5*  HCT 34.4* 34.0* 30.4*  MCV 92.5  --  91.3  PLT 178  --  149*   Basic Metabolic Panel: Recent Labs  Lab 07/15/22 1506 07/15/22 1521 07/16/22 0520 07/17/22 0357  NA 120* 123* 124* 127*  K 3.1* 4.1 4.3 4.5  CL 87* 90* 98 98  CO2 20*  --  19* 16*  GLUCOSE 116* 126* 135* 134*  BUN 29* 34* 21* 23*  CREATININE 1.23 1.00 0.75 0.75  CALCIUM 7.8*  --  7.7* 8.0*  MG 1.7  --  2.2  --    GFR: Estimated Creatinine Clearance: 67.6 mL/min (by C-G formula based on SCr of 0.75 mg/dL). Liver Function Tests: No results for input(s): "AST", "ALT", "ALKPHOS", "BILITOT", "PROT", "ALBUMIN" in the last 168 hours. No results for input(s): "LIPASE", "AMYLASE" in the last 168 hours. No results for input(s): "AMMONIA" in the last 168 hours. Coagulation Profile: No results for input(s): "INR", "PROTIME" in the last 168 hours. Cardiac Enzymes: No results for input(s): "CKTOTAL", "CKMB", "CKMBINDEX", "TROPONINI" in the last 168 hours. BNP (last 3 results) No results for input(s): "PROBNP" in the last 8760 hours. HbA1C: No results for input(s): "HGBA1C" in the last 72 hours. CBG: No results for input(s): "GLUCAP" in the last 168 hours. Lipid Profile: No results for input(s): "CHOL", "HDL", "LDLCALC", "TRIG", "CHOLHDL", "LDLDIRECT" in the last 72 hours. Thyroid Function Tests: Recent Labs    07/15/22 1737  TSH 1.387   Anemia Panel: No results for input(s): "VITAMINB12", "FOLATE", "FERRITIN", "TIBC", "IRON", "RETICCTPCT" in the last 72 hours. Sepsis Labs: Recent Labs  Lab 07/15/22 1518 07/15/22 1737 07/15/22 1957  LATICACIDVEN 4.2* 4.5* 2.6*    Recent Results (from the past 240 hour(s))  MRSA Next Gen by PCR, Nasal      Status: None   Collection Time: 07/15/22  8:35 PM   Specimen: Nasal Mucosa; Nasal Swab  Result Value Ref Range Status   MRSA by PCR Next Gen NOT DETECTED NOT DETECTED Final    Comment: (NOTE) The GeneXpert MRSA Assay (FDA approved for NASAL specimens only), is one component of a comprehensive MRSA colonization surveillance program. It is not intended to diagnose MRSA infection nor to guide or monitor treatment for MRSA infections. Test performance is not FDA approved in patients less than 67 years old. Performed at [facility], [address], [place], [place] [address]          Radiology Studies: ECHOCARDIOGRAM COMPLETE  Result Date: 07/16/2022    ECHOCARDIOGRAM REPORT   Patient Name:   [name] Stone Date of Exam: 07/16/2022 Medical Rec #:  [id number]  Height:       66.0 in Accession #:    [id number]    Weight:       108.9 lb Date of Birth:  1960/07/18     BSA:          1.545 m Patient Age:    61 years      BP:           153/90 mmHg Patient Gender: M             HR:           99 bpm. [facility] Procedure: 2D Echo, Cardiac Doppler and Color Doppler Indications:    SVT (supraventricular tachycardia) [[id number]]  History:        Patient has no prior history of Echocardiogram examinations.                 Cancer, Arrythmias:Tachycardia; Risk Factors:Current Smoker.  Sonographer:    Aron Baba Referring Phys: [id number] Onnie Boer  Sonographer Comments: Image acquisition challenging due to patient body habitus. IMPRESSIONS  1. Left ventricular ejection fraction, by estimation, is 40 to 45%. The left ventricle has mildly decreased function. The left ventricle demonstrates global hypokinesis. Left ventricular diastolic parameters are indeterminate.  2. Right ventricular systolic function is normal. The right ventricular size is normal. There is moderately elevated pulmonary artery systolic pressure.  3. The mitral valve was not well visualized. No evidence of mitral valve  regurgitation. No evidence of mitral stenosis.  4. The tricuspid valve is abnormal. Tricuspid valve regurgitation is moderate.  5. The aortic valve was not well visualized. Aortic valve regurgitation is not visualized. No aortic stenosis is present.  6. Pulmonic valve regurgitation not able to assess. not able to assess pulmonic stenosis.  7. The inferior vena cava is dilated in size with >50% respiratory variability, suggesting right atrial pressure of 8 mmHg.  8. Technically difficult study FINDINGS  Left Ventricle: Left ventricular ejection fraction, by estimation, is 40 to 45%. The left ventricle has mildly decreased function. The left ventricle demonstrates global hypokinesis. The left ventricular internal cavity size was normal in size. There is  no left ventricular hypertrophy. Left ventricular diastolic parameters are indeterminate. Right Ventricle: The right ventricular size is normal. Right vetricular wall thickness was not well visualized. Right ventricular systolic function is normal. There is moderately elevated pulmonary artery systolic pressure. The tricuspid regurgitant velocity is 3.18 m/s, and with an assumed right atrial pressure of 8 mmHg, the estimated right ventricular systolic pressure is 48.4 mmHg. Left Atrium: Left atrial size was not well visualized. Right Atrium: Right atrial size was not well visualized. Pericardium: There is no evidence of pericardial effusion. Mitral Valve: The mitral valve was not well visualized. No evidence of mitral valve regurgitation. No evidence of mitral valve stenosis. Tricuspid Valve: The tricuspid valve is abnormal. Tricuspid valve regurgitation is moderate . No evidence of tricuspid stenosis. Aortic Valve: The aortic valve was not well visualized. Aortic valve regurgitation is not visualized. No aortic stenosis is present. Aortic valve mean gradient measures 2.4 mmHg. Aortic valve peak gradient measures 4.6 mmHg. Aortic valve area, by VTI measures 1.74 cm.  Pulmonic Valve: The pulmonic valve was not well visualized. Pulmonic valve regurgitation not able to assess. Not able to assess pulmonic stenosis. Aorta: The aortic root is normal in size and structure. Venous: The inferior vena cava is dilated in size with greater than 50% respiratory variability, suggesting right atrial pressure of 8 mmHg.  IAS/Shunts: No atrial level shunt detected by color flow Doppler.  LEFT VENTRICLE PLAX 2D LVIDd:         4.10 cm     Diastology LVIDs:         3.30 cm     LV e' medial:    6.31 cm/s LV PW:         1.00 cm     LV E/e' medial:  12.3 LV IVS:        0.70 cm     LV e' lateral:   11.90 cm/s LVOT diam:     1.90 cm     LV E/e' lateral: 6.5 LV SV:         32 LV SV Index:   21 LVOT Area:     2.84 cm  LV Volumes (MOD) LV vol d, MOD A2C: 72.2 ml LV vol d, MOD A4C: 81.9 ml LV vol s, MOD A2C: 42.1 ml LV vol s, MOD A4C: 42.3 ml LV SV MOD A2C:     30.1 ml LV SV MOD A4C:     81.9 ml LV SV MOD BP:      33.8 ml RIGHT VENTRICLE RV S prime:     15.10 cm/s TAPSE (M-mode): 2.7 cm LEFT ATRIUM           Index        RIGHT ATRIUM           Index LA diam:      4.40 cm 2.85 cm/m   RA Area:     11.40 cm LA Vol (A4C): 99.7 ml 64.55 ml/m  RA Volume:   23.70 ml  15.34 ml/m  AORTIC VALVE AV Area (Vmax):    1.83 cm AV Area (Vmean):   1.82 cm AV Area (VTI):     1.74 cm AV Vmax:           107.08 cm/s AV Vmean:          72.611 cm/s AV VTI:            0.186 m AV Peak Grad:      4.6 mmHg AV Mean Grad:      2.4 mmHg LVOT Vmax:         69.00 cm/s LVOT Vmean:        46.700 cm/s LVOT VTI:          0.114 m LVOT/AV VTI ratio: 0.61  AORTA Ao Root diam: 3.80 cm MITRAL VALVE               TRICUSPID VALVE MV Area (PHT): 4.17 cm    TR Peak grad:   40.4 mmHg MV Decel Time: 182 msec    TR Vmax:        318.00 cm/s MV E velocity: 77.60 cm/s MV A velocity: 51.40 cm/s  SHUNTS MV E/A ratio:  1.51        Systemic VTI:  0.11 m                            Systemic Diam: 1.90 cm Dina Rich MD Electronically signed by Dina Rich MD Signature Date/Time: 07/16/2022/12:08:24 PM    Final    CT Angio Chest PE W and/or Wo Contrast  Result Date: 07/15/2022 CLINICAL DATA:  Abdominal trauma. Concern for pulmonary embolism. Lung cancer. EXAM: CT ANGIOGRAPHY CHEST CT ABDOMEN AND PELVIS WITH CONTRAST TECHNIQUE: Multidetector CT imaging of the chest was performed using the  standard protocol during bolus administration of intravenous contrast. Multiplanar CT image reconstructions and MIPs were obtained to evaluate the vascular anatomy. Multidetector CT imaging of the abdomen and pelvis was performed using the standard protocol during bolus administration of intravenous contrast. RADIATION DOSE REDUCTION: This exam was performed according to the departmental dose-optimization program which includes automated exposure control, adjustment of the mA and/or kV according to patient size and/or use of iterative reconstruction technique. CONTRAST:  OMNIPAQUE IOHEXOL 350 MG/ML SOLN COMPARISON:  Chest radiograph dated 07/15/2022. PET CT dated 06/04/2022.Chest CT dated 05/28/2022. FINDINGS: Evaluation of this exam is limited due to respiratory motion artifact. CTA CHEST FINDINGS Cardiovascular: There is no cardiomegaly or pericardial effusion. Mild atherosclerotic calcification of the thoracic aorta. No aneurysmal dilatation or dissection. No pulmonary artery embolus identified. Mediastinum/Nodes: Right hilar and subcarinal adenopathy relatively similar to the CT of 05/28/2022. The esophagus is grossly unremarkable. No mediastinal fluid collection. Lungs/Pleura: Overall slight interval increase in the size of bilateral pulmonary metastatic lesions seen space CT of 05/28/2022. For example a 1.5 cm nodule in the left lower lobe (104/8) previously measured approximately 1.0 cm. There is a small right pleural effusion. No pneumothorax. The central airways are patent. Musculoskeletal: Osteopenia with degenerative changes of the spine. Similar pattern  osseous metastatic disease as CT of 05/28/2022. No acute osseous pathology. Loss of subcutaneous fat and cachexia. Review of the MIP images confirms the above findings. CT ABDOMEN and PELVIS FINDINGS No intra-abdominal free air or free fluid. Hepatobiliary: The liver is unremarkable. No biliary dilatation. The gallbladder is unremarkable. Pancreas: No acute findings. Spleen: Normal in size without focal abnormality. Adrenals/Urinary Tract: The adrenal glands are unremarkable. Kidneys, visualized ureters, and urinary bladder appear unremarkable. Stomach/Bowel: There is no bowel obstruction or active inflammation. The appendix is normal. Vascular/Lymphatic: Mild aortoiliac atherosclerotic disease. The IVC is unremarkable. No portal venous gas. No adenopathy. Reproductive: The prostate and seminal vesicles are grossly unremarkable. No pelvic mass. Other: None Musculoskeletal: Osteopenia with degenerative changes of the spine. Similar appearance of L2 sclerotic metastatic disease. No acute osseous pathology. Review of the MIP images confirms the above findings. IMPRESSION: 1. No acute intrathoracic, abdominal, or pelvic pathology. No pulmonary artery embolus identified. 2. Overall slight interval increase in the size of bilateral pulmonary metastatic lesions compared to the CT of 05/28/2022. 3. Small right pleural effusion. 4. Similar pattern of osseous metastatic disease as CT of 05/28/2022. 5. No definite evidence of metastasis in the abdomen and pelvis. 6.  Aortic Atherosclerosis (ICD10-I70.0). Electronically Signed   By: Elgie Collard M.D.   On: 07/15/2022 17:19   CT ABDOMEN PELVIS W CONTRAST  Result Date: 07/15/2022 CLINICAL DATA:  Abdominal trauma. Concern for pulmonary embolism. Lung cancer. EXAM: CT ANGIOGRAPHY CHEST CT ABDOMEN AND PELVIS WITH CONTRAST TECHNIQUE: Multidetector CT imaging of the chest was performed using the standard protocol during bolus administration of intravenous contrast. Multiplanar  CT image reconstructions and MIPs were obtained to evaluate the vascular anatomy. Multidetector CT imaging of the abdomen and pelvis was performed using the standard protocol during bolus administration of intravenous contrast. RADIATION DOSE REDUCTION: This exam was performed according to the departmental dose-optimization program which includes automated exposure control, adjustment of the mA and/or kV according to patient size and/or use of iterative reconstruction technique. CONTRAST:  OMNIPAQUE IOHEXOL 350 MG/ML SOLN COMPARISON:  Chest radiograph dated 07/15/2022. PET CT dated 06/04/2022.Chest CT dated 05/28/2022. FINDINGS: Evaluation of this exam is limited due to respiratory motion artifact. CTA CHEST FINDINGS Cardiovascular: There  is no cardiomegaly or pericardial effusion. Mild atherosclerotic calcification of the thoracic aorta. No aneurysmal dilatation or dissection. No pulmonary artery embolus identified. Mediastinum/Nodes: Right hilar and subcarinal adenopathy relatively similar to the CT of 05/28/2022. The esophagus is grossly unremarkable. No mediastinal fluid collection. Lungs/Pleura: Overall slight interval increase in the size of bilateral pulmonary metastatic lesions seen space CT of 05/28/2022. For example a 1.5 cm nodule in the left lower lobe (104/8) previously measured approximately 1.0 cm. There is a small right pleural effusion. No pneumothorax. The central airways are patent. Musculoskeletal: Osteopenia with degenerative changes of the spine. Similar pattern osseous metastatic disease as CT of 05/28/2022. No acute osseous pathology. Loss of subcutaneous fat and cachexia. Review of the MIP images confirms the above findings. CT ABDOMEN and PELVIS FINDINGS No intra-abdominal free air or free fluid. Hepatobiliary: The liver is unremarkable. No biliary dilatation. The gallbladder is unremarkable. Pancreas: No acute findings. Spleen: Normal in size without focal abnormality.  Adrenals/Urinary Tract: The adrenal glands are unremarkable. Kidneys, visualized ureters, and urinary bladder appear unremarkable. Stomach/Bowel: There is no bowel obstruction or active inflammation. The appendix is normal. Vascular/Lymphatic: Mild aortoiliac atherosclerotic disease. The IVC is unremarkable. No portal venous gas. No adenopathy. Reproductive: The prostate and seminal vesicles are grossly unremarkable. No pelvic mass. Other: None Musculoskeletal: Osteopenia with degenerative changes of the spine. Similar appearance of L2 sclerotic metastatic disease. No acute osseous pathology. Review of the MIP images confirms the above findings. IMPRESSION: 1. No acute intrathoracic, abdominal, or pelvic pathology. No pulmonary artery embolus identified. 2. Overall slight interval increase in the size of bilateral pulmonary metastatic lesions compared to the CT of 05/28/2022. 3. Small right pleural effusion. 4. Similar pattern of osseous metastatic disease as CT of 05/28/2022. 5. No definite evidence of metastasis in the abdomen and pelvis. 6.  Aortic Atherosclerosis (ICD10-I70.0). Electronically Signed   By: Elgie Collard M.D.   On: 07/15/2022 17:19   CT Head Wo Contrast  Result Date: 07/15/2022 CLINICAL DATA:  Weakness and tachycardia. 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 EXAM: CT HEAD WITHOUT CONTRAST TECHNIQUE: Contiguous axial images were obtained from the base of the skull through the vertex without intravenous contrast. RADIATION DOSE REDUCTION: This exam was performed according to the departmental dose-optimization program which includes automated exposure control, adjustment of the mA and/or kV according to patient size and/or use of iterative reconstruction technique. COMPARISON:  MRI head 06/02/2022 and CT head 05/07/2001 FINDINGS: Brain: No intracranial hemorrhage, mass effect, or evidence of acute infarct. No hydrocephalus. No extra-axial fluid collection. Generalized cerebral atrophy.  Ill-defined hypoattenuation within the cerebral white matter is nonspecific but consistent with chronic small vessel ischemic disease. Chronic right thalamic infarcts. Vascular: No hyperdense vessel. Intracranial arterial calcification. Skull: No fracture or focal lesion. Sinuses/Orbits: No acute finding. Paranasal sinuses and mastoid air cells are well aerated. Other: None. IMPRESSION: 1. No evidence of acute intracranial abnormality. Electronically Signed   By: Minerva Fester M.D.   On: 07/15/2022 17:05   DG Chest Port 1 View  Result Date: 07/15/2022 CLINICAL DATA:  Weakness since Monday, SVT, cancer patient, smoker EXAM: PORTABLE CHEST 1 VIEW COMPARISON:  Portable exam 1527 hours compared to 06/25/2022 FINDINGS: External pacing leads. Normal heart size, mediastinal contours, and pulmonary vascularity. Emphysematous changes with multiple small nodular foci in both lungs consistent with pulmonary metastases. Confluence mass identified mid to lateral RIGHT lung. RIGHT apical pleuroparenchymal opacity unchanged. No new infiltrate or pneumothorax. Trace RIGHT pleural effusion. Bones demineralized. IMPRESSION: Pulmonary metastases  greatest lateral mid RIGHT lung. Underlying COPD changes. Emphysema (ICD10-J43.9). Electronically Signed   By: Ulyses Southward M.D.   On: 07/15/2022 15:38        Scheduled Meds:  (feeding supplement) PROSource Plus  30 mL Oral TID with meals   amiodarone  200 mg Oral BID   Chlorhexidine Gluconate Cloth  6 each Topical Daily   heparin  5,000 Units Subcutaneous Q8H   ipratropium-albuterol  3 mL Nebulization QID   lactose free nutrition  237 mL Oral TID BM   megestrol  400 mg Oral BID   metoprolol succinate  25 mg Oral Daily   multivitamin-lutein  1 capsule Oral Daily   predniSONE  40 mg Oral Q breakfast   sacubitril-valsartan  1 tablet Oral BID   spironolactone  12.5 mg Oral Daily   Continuous Infusions:  azithromycin 500 mg (07/16/22 2059)     LOS: 2 days    Time  spent: 45 minutes spent on chart review, discussion with nursing staff, consultants, updating family and interview/physical exam; more than 50% of that time was spent in counseling and/or coordination of care.    Rudransh Bellanca Hoover Brunette, DO [facility] Available via Epic secure chat 7am-7pm After these hours, please refer to coverage provider listed on [email or web address] 07/17/2022, 10:50 AM

## 2022-07-17 NOTE — Progress Notes (Signed)
PT Cancellation Note  Patient Details Name: [name] Stone MRN: [id number] DOB: September 14, 1960   Cancelled Treatment:    Reason Eval/Treat Not Completed: Medical issues which prohibited therapy  Physical therapy held per RN due to not medically stable at the moment.  Will check back tomorrow.   3:01 PM, 07/17/22 Ocie Bob, MPT Physical Therapist with [facility] [phone] office [phone] mobile phone

## 2022-07-17 NOTE — Progress Notes (Signed)
Patient is asleep times 2 checks with family. Did not awaken for breathing treatment. ( Neb). Will treat when awake.

## 2022-07-18 DIAGNOSIS — I471 Supraventricular tachycardia, unspecified: Secondary | ICD-10-CM | POA: Diagnosis not present

## 2022-07-18 LAB — BASIC METABOLIC PANEL
Anion gap: 8 (ref 5–15)
BUN: 24 mg/dL — ABNORMAL HIGH (ref 6–20)
CO2: 21 mmol/L — ABNORMAL LOW (ref 22–32)
Calcium: 7.7 mg/dL — ABNORMAL LOW (ref 8.9–10.3)
Chloride: 97 mmol/L — ABNORMAL LOW (ref 98–111)
Creatinine, Ser: 0.76 mg/dL (ref 0.61–1.24)
GFR, Estimated: >60 mL/min (ref >60)
Glucose, Bld: 134 mg/dL — ABNORMAL HIGH (ref 70–99)
Potassium: 3.6 mmol/L (ref 3.5–5.1)
Sodium: 126 mmol/L — ABNORMAL LOW (ref 135–145)

## 2022-07-18 LAB — CBC
HCT: 30.1 % — ABNORMAL LOW (ref 39.0–52.0)
Hemoglobin: 10.4 g/dL — ABNORMAL LOW (ref 13.0–17.0)
MCH: 31.3 pg (ref 26.0–34.0)
MCHC: 34.6 g/dL (ref 30.0–36.0)
MCV: 90.7 fL (ref 80.0–100.0)
Platelets: 165 10*3/uL (ref 150–400)
RBC: 3.32 MIL/uL — ABNORMAL LOW (ref 4.22–5.81)
RDW: 15.7 % — ABNORMAL HIGH (ref 11.5–15.5)
WBC: 12.9 10*3/uL — ABNORMAL HIGH (ref 4.0–10.5)
nRBC: 0 % (ref 0.0–0.2)

## 2022-07-18 LAB — MAGNESIUM: Magnesium: 1.7 mg/dL (ref 1.7–2.4)

## 2022-07-18 MED ORDER — ONDANSETRON HCL 4 MG PO TABS
4.0000 mg | ORAL_TABLET | Freq: Four times a day (QID) | ORAL | Status: DC
Start: 2022-07-18 — End: 2022-07-18

## 2022-07-18 MED ORDER — PANTOPRAZOLE SODIUM 40 MG IV SOLR
40.0000 mg | INTRAVENOUS | Status: DC
  Administered 2022-07-18 – 2022-07-22 (×5): 40 mg via INTRAVENOUS
  Filled 2022-07-18 (×5): qty 10

## 2022-07-18 MED ORDER — ONDANSETRON HCL 4 MG/2ML IJ SOLN
4.0000 mg | Freq: Four times a day (QID) | INTRAMUSCULAR | Status: DC
  Administered 2022-07-18 – 2022-07-20 (×6): 4 mg via INTRAVENOUS
  Filled 2022-07-18 (×6): qty 2

## 2022-07-18 MED ORDER — ALBUTEROL SULFATE (2.5 MG/3ML) 0.083% IN NEBU
INHALATION_SOLUTION | RESPIRATORY_TRACT | Status: AC
  Administered 2022-07-18: 2.5 mg
  Filled 2022-07-18: qty 3

## 2022-07-18 NOTE — Progress Notes (Signed)
Family did not want patient awakened for breathing treatment. [name] Stone appeared to be resting comfortably on 2L/m with SpO2 of 98%. RN notified.

## 2022-07-18 NOTE — Progress Notes (Signed)
PROGRESS NOTE    [name] Stone  ZOX:[id number] DOB: Nov 20, 1960 DOA: 07/15/2022 PCP: Patient, No Pcp Per    Brief Narrative:   [name] Stone is a 62 y.o. male with medical history significant for recent lung cancer diagnosis, tobacco abuse.  Patient was brought to the ED via EMS with reports of generalized weakness, 3 falls over the past 24 hours.  EMS found the patient to be in and out of SVT.  Patient denies palpitation, no chest pain.  Reports some dizziness when standing, and difficulty breathing with exertion.  No lower extremity swelling.  No fevers no chills.  He has chronic cough that has worsened over the past few days, with some rattling. He has had chronic poor oral intake, with weight loss. He was recently diagnosed with metastatic lung cancer, and has been evaluated by Dr. Ellin Saba, he has not started therapy.  Assessment and Plan: * SVT (supraventricular tachycardia) Heart rates up to 180s.  Temporary break with 6 mg and then 12 mg of adenosine, and was back in SVT.  150 mg amiodarone bolus given, currently on amiodarone drip.  Heart rate controlled in the 100s.  No cardiac history.  No chest pain.  No palpitation.  Reports some dizziness, dyspnea on exertion.  Hypokalemia 3.1.  Magnesium 1.7.  Was hypotensive to 70s, with resultant lactic acidosis. -Cardiology ongoing recommendations appreciated and patient on oral amiodarone -Replete potassium. -Echocardiogram with LVEF 40-45% -Lactic acidosis trending down -Okay for transfer to telemetry for continued monitoring of nausea and vomiting to see if oral medications tolerated  Acute bronchitis Dyspnea on exertion, diffuse rhonchi, worsening cough.  Denies diagnosis of COPD.  Ongoing tobacco abuse 1 pack/day.  CTA chest negative for acute abnormality.  O2 sats -DuoNebs as needed and scheduled -Mucolytics as needed -Azithromycin -Continue on IV methylprednisolone as patient is not tolerating much oral intake  AKI (acute kidney  injury) (HCC)-resolved Continue to monitor a.m. labs  Chronic hyponatremia Likely in the setting of poor oral intake with occasional vomiting, continue to monitor  Metastatic cancer [facility]) Following with Dr. Ellin Saba.  New diagnosis of non-small cell lung cancer.  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  CT chest 05/28/22 showed numerous bilateral lung nodules.  Right brain no metastatic disease, PET scan 3/25 - Innumerable bilateral hypermetabolic lung nodules, lymphadenopathy.   -Seen by oncology Dr. Ellin Saba 5/3.  Continue to monitor for any overall improvement and if possible, we will try for palliative chemotherapy otherwise will require hospice evaluation  Intractable nausea and vomiting Schedule Zofran Phenergan as needed Continue IV fluid  DVT prophylaxis: heparin injection 5,000 Units Start: 07/15/22 2200    Code Status: Full Code Family Communication: Son at bedside 5/4  Disposition Plan:  Level of care: Stepdown Status is: Inpatient Remains inpatient appropriate because: needs IV meds    Consultants:  Cards Oncology   Subjective: Patient seen and evaluated this a.m. with ongoing nausea and vomiting noted.  Continues to pass gas with no abdominal pain and did have bowel movement a couple days ago.  Son at bedside.  Objective: Vitals:   07/18/22 0800 07/18/22 0900 07/18/22 1000 07/18/22 1100  BP: 131/80 134/81 (!) 151/79 125/70  Pulse: 100 96 (!) 102 (!) 101  Resp: 20 17 (!) 26 (!) 32  Temp:      TempSrc:      SpO2: 95% 97% 95% 95%  Weight:      Height:        Intake/Output Summary (Last 24 hours)  at 07/18/2022 1122 Last data filed at 07/18/2022 0700 Gross per 24 hour  Intake 1954.42 ml  Output 450 ml  Net 1504.42 ml   Filed Weights   07/16/22 0453 07/17/22 0506 07/18/22 0522  Weight: 49.4 kg 48.7 kg 50.7 kg    Examination:   General: Appearance:    Thin male in no acute distress, somnolent     Lungs:     Rhonchus on right, few expiratory wheezes,  respirations unlabored- not on O2  Heart:    Tachycardic.    MS:   All extremities are intact.    Neurologic:   Awake, alert, oriented x 3. No apparent focal neurological           defect.        Data Reviewed: I have personally reviewed following labs and imaging studies  CBC: Recent Labs  Lab 07/15/22 1506 07/15/22 1521 07/16/22 0520 07/18/22 0418  WBC 11.5*  --  7.8 12.9*  HGB 11.7* 11.6* 10.5* 10.4*  HCT 34.4* 34.0* 30.4* 30.1*  MCV 92.5  --  91.3 90.7  PLT 178  --  149* 165   Basic Metabolic Panel: Recent Labs  Lab 07/15/22 1506 07/15/22 1521 07/16/22 0520 07/17/22 0357 07/18/22 0418  NA 120* 123* 124* 127* 126*  K 3.1* 4.1 4.3 4.5 3.6  CL 87* 90* 98 98 97*  CO2 20*  --  19* 16* 21*  GLUCOSE 116* 126* 135* 134* 134*  BUN 29* 34* 21* 23* 24*  CREATININE 1.23 1.00 0.75 0.75 0.76  CALCIUM 7.8*  --  7.7* 8.0* 7.7*  MG 1.7  --  2.2  --  1.7   GFR: Estimated Creatinine Clearance: 70.4 mL/min (by C-G formula based on SCr of 0.76 mg/dL). Liver Function Tests: No results for input(s): "AST", "ALT", "ALKPHOS", "BILITOT", "PROT", "ALBUMIN" in the last 168 hours. No results for input(s): "LIPASE", "AMYLASE" in the last 168 hours. No results for input(s): "AMMONIA" in the last 168 hours. Coagulation Profile: No results for input(s): "INR", "PROTIME" in the last 168 hours. Cardiac Enzymes: No results for input(s): "CKTOTAL", "CKMB", "CKMBINDEX", "TROPONINI" in the last 168 hours. BNP (last 3 results) No results for input(s): "PROBNP" in the last 8760 hours. HbA1C: No results for input(s): "HGBA1C" in the last 72 hours. CBG: No results for input(s): "GLUCAP" in the last 168 hours. Lipid Profile: No results for input(s): "CHOL", "HDL", "LDLCALC", "TRIG", "CHOLHDL", "LDLDIRECT" in the last 72 hours. Thyroid Function Tests: Recent Labs    07/15/22 1737  TSH 1.387   Anemia Panel: No results for input(s): "VITAMINB12", "FOLATE", "FERRITIN", "TIBC", "IRON",  "RETICCTPCT" in the last 72 hours. Sepsis Labs: Recent Labs  Lab 07/15/22 1518 07/15/22 1737 07/15/22 1957  LATICACIDVEN 4.2* 4.5* 2.6*    Recent Results (from the past 240 hour(s))  MRSA Next Gen by PCR, Nasal     Status: None   Collection Time: 07/15/22  8:35 PM   Specimen: Nasal Mucosa; Nasal Swab  Result Value Ref Range Status   MRSA by PCR Next Gen NOT DETECTED NOT DETECTED Final    Comment: (NOTE) The GeneXpert MRSA Assay (FDA approved for NASAL specimens only), is one component of a comprehensive MRSA colonization surveillance program. It is not intended to diagnose MRSA infection nor to guide or monitor treatment for MRSA infections. Test performance is not FDA approved in patients less than 10 years old. Performed at [facility], [address] [place], [place] [address]  Radiology Studies: ECHOCARDIOGRAM COMPLETE  Result Date: 07/16/2022    ECHOCARDIOGRAM REPORT   Patient Name:   OREY HAIDER Date of Exam: 07/16/2022 Medical Rec #:  [id number]     Height:       66.0 in Accession #:    [id number]    Weight:       108.9 lb Date of Birth:  01-Nov-1960     BSA:          1.545 m Patient Age:    61 years      BP:           153/90 mmHg Patient Gender: M             HR:           99 bpm. [facility] Procedure: 2D Echo, Cardiac Doppler and Color Doppler Indications:    SVT (supraventricular tachycardia) [202906]  History:        Patient has no prior history of Echocardiogram examinations.                 Cancer, Arrythmias:Tachycardia; Risk Factors:Current Smoker.  Sonographer:    Aron Baba Referring Phys: [id number] Onnie Boer  Sonographer Comments: Image acquisition challenging due to patient body habitus. IMPRESSIONS  1. Left ventricular ejection fraction, by estimation, is 40 to 45%. The left ventricle has mildly decreased function. The left ventricle demonstrates global hypokinesis. Left ventricular diastolic parameters are indeterminate.  2. Right  ventricular systolic function is normal. The right ventricular size is normal. There is moderately elevated pulmonary artery systolic pressure.  3. The mitral valve was not well visualized. No evidence of mitral valve regurgitation. No evidence of mitral stenosis.  4. The tricuspid valve is abnormal. Tricuspid valve regurgitation is moderate.  5. The aortic valve was not well visualized. Aortic valve regurgitation is not visualized. No aortic stenosis is present.  6. Pulmonic valve regurgitation not able to assess. not able to assess pulmonic stenosis.  7. The inferior vena cava is dilated in size with >50% respiratory variability, suggesting right atrial pressure of 8 mmHg.  8. Technically difficult study FINDINGS  Left Ventricle: Left ventricular ejection fraction, by estimation, is 40 to 45%. The left ventricle has mildly decreased function. The left ventricle demonstrates global hypokinesis. The left ventricular internal cavity size was normal in size. There is  no left ventricular hypertrophy. Left ventricular diastolic parameters are indeterminate. Right Ventricle: The right ventricular size is normal. Right vetricular wall thickness was not well visualized. Right ventricular systolic function is normal. There is moderately elevated pulmonary artery systolic pressure. The tricuspid regurgitant velocity is 3.18 m/s, and with an assumed right atrial pressure of 8 mmHg, the estimated right ventricular systolic pressure is 48.4 mmHg. Left Atrium: Left atrial size was not well visualized. Right Atrium: Right atrial size was not well visualized. Pericardium: There is no evidence of pericardial effusion. Mitral Valve: The mitral valve was not well visualized. No evidence of mitral valve regurgitation. No evidence of mitral valve stenosis. Tricuspid Valve: The tricuspid valve is abnormal. Tricuspid valve regurgitation is moderate . No evidence of tricuspid stenosis. Aortic Valve: The aortic valve was not well  visualized. Aortic valve regurgitation is not visualized. No aortic stenosis is present. Aortic valve mean gradient measures 2.4 mmHg. Aortic valve peak gradient measures 4.6 mmHg. Aortic valve area, by VTI measures 1.74 cm. Pulmonic Valve: The pulmonic valve was not well visualized. Pulmonic valve regurgitation not able to assess. Not able to  assess pulmonic stenosis. Aorta: The aortic root is normal in size and structure. Venous: The inferior vena cava is dilated in size with greater than 50% respiratory variability, suggesting right atrial pressure of 8 mmHg. IAS/Shunts: No atrial level shunt detected by color flow Doppler.  LEFT VENTRICLE PLAX 2D LVIDd:         4.10 cm     Diastology LVIDs:         3.30 cm     LV e' medial:    6.31 cm/s LV PW:         1.00 cm     LV E/e' medial:  12.3 LV IVS:        0.70 cm     LV e' lateral:   11.90 cm/s LVOT diam:     1.90 cm     LV E/e' lateral: 6.5 LV SV:         32 LV SV Index:   21 LVOT Area:     2.84 cm  LV Volumes (MOD) LV vol d, MOD A2C: 72.2 ml LV vol d, MOD A4C: 81.9 ml LV vol s, MOD A2C: 42.1 ml LV vol s, MOD A4C: 42.3 ml LV SV MOD A2C:     30.1 ml LV SV MOD A4C:     81.9 ml LV SV MOD BP:      33.8 ml RIGHT VENTRICLE RV S prime:     15.10 cm/s TAPSE (M-mode): 2.7 cm LEFT ATRIUM           Index        RIGHT ATRIUM           Index LA diam:      4.40 cm 2.85 cm/m   RA Area:     11.40 cm LA Vol (A4C): 99.7 ml 64.55 ml/m  RA Volume:   23.70 ml  15.34 ml/m  AORTIC VALVE AV Area (Vmax):    1.83 cm AV Area (Vmean):   1.82 cm AV Area (VTI):     1.74 cm AV Vmax:           107.08 cm/s AV Vmean:          72.611 cm/s AV VTI:            0.186 m AV Peak Grad:      4.6 mmHg AV Mean Grad:      2.4 mmHg LVOT Vmax:         69.00 cm/s LVOT Vmean:        46.700 cm/s LVOT VTI:          0.114 m LVOT/AV VTI ratio: 0.61  AORTA Ao Root diam: 3.80 cm MITRAL VALVE               TRICUSPID VALVE MV Area (PHT): 4.17 cm    TR Peak grad:   40.4 mmHg MV Decel Time: 182 msec    TR Vmax:         318.00 cm/s MV E velocity: 77.60 cm/s MV A velocity: 51.40 cm/s  SHUNTS MV E/A ratio:  1.51        Systemic VTI:  0.11 m                            Systemic Diam: 1.90 cm Dina Rich MD Electronically signed by Dina Rich MD Signature Date/Time: 07/16/2022/12:08:24 PM    Final         Scheduled Meds:  (feeding  supplement) PROSource Plus  30 mL Oral TID with meals   Chlorhexidine Gluconate Cloth  6 each Topical Daily   heparin  5,000 Units Subcutaneous Q8H   ipratropium-albuterol  3 mL Nebulization TID   lactose free nutrition  237 mL Oral TID BM   megestrol  400 mg Oral BID   methylPREDNISolone (SOLU-MEDROL) injection  40 mg Intravenous Q12H   metoprolol succinate  25 mg Oral Daily   multivitamin-lutein  1 capsule Oral Daily   ondansetron (ZOFRAN) IV  4 mg Intravenous Q6H   pantoprazole (PROTONIX) IV  40 mg Intravenous Q24H   sacubitril-valsartan  1 tablet Oral BID   Continuous Infusions:  sodium chloride 75 mL/hr at 07/18/22 0700   amiodarone 30 mg/hr (07/18/22 0808)   azithromycin Stopped (07/17/22 2206)   promethazine (PHENERGAN) injection (IM or IVPB) Stopped (07/17/22 1506)     LOS: 3 days    Time spent: 45 minutes spent on chart review, discussion with nursing staff, consultants, updating family and interview/physical exam; more than 50% of that time was spent in counseling and/or coordination of care.    Inri Sobieski Hoover Brunette, DO [facility] Available via Epic secure chat 7am-7pm After these hours, please refer to coverage provider listed on [email or web address] 07/18/2022, 11:22 AM

## 2022-07-18 NOTE — Progress Notes (Signed)
Patient asleep, family declined nebulizer at this time.

## 2022-07-18 NOTE — TOC Progression Note (Signed)
Transition of Care [facility]) - Progression Note    Patient Details  Name: [name] Stone MRN: [id number] Date of Birth: [birth year]  Transition of Care [facility]) CM/SW Contact  Catalina Gravel, LCSW Phone Number: 07/18/2022, 3:07 PM  Clinical Narrative:    Pt not doing well. MD will continue to assess, possible need for palliative care or hospice consult. TOC to follow.      Barriers to Discharge: Continued Medical Work up  Expected Discharge Plan and Services                                               Social Determinants of Health (SDOH) Interventions SDOH Screenings   Food Insecurity: No Food Insecurity (07/15/2022)  Housing: Low Risk  (07/15/2022)  Transportation Needs: No Transportation Needs (07/15/2022)  Utilities: Not At Risk (07/15/2022)  Depression (PHQ2-9): Low Risk  (06/08/2022)  Tobacco Use: High Risk (07/16/2022)    Readmission Risk Interventions     No data to display

## 2022-07-19 DIAGNOSIS — I471 Supraventricular tachycardia, unspecified: Secondary | ICD-10-CM | POA: Diagnosis not present

## 2022-07-19 LAB — BASIC METABOLIC PANEL
Anion gap: 7 (ref 5–15)
BUN: 23 mg/dL — ABNORMAL HIGH (ref 6–20)
CO2: 22 mmol/L (ref 22–32)
Calcium: 7.4 mg/dL — ABNORMAL LOW (ref 8.9–10.3)
Chloride: 96 mmol/L — ABNORMAL LOW (ref 98–111)
Creatinine, Ser: 0.86 mg/dL (ref 0.61–1.24)
GFR, Estimated: >60 mL/min (ref >60)
Glucose, Bld: 127 mg/dL — ABNORMAL HIGH (ref 70–99)
Potassium: 3.7 mmol/L (ref 3.5–5.1)
Sodium: 125 mmol/L — ABNORMAL LOW (ref 135–145)

## 2022-07-19 LAB — CBC
HCT: 32.1 % — ABNORMAL LOW (ref 39.0–52.0)
Hemoglobin: 10.9 g/dL — ABNORMAL LOW (ref 13.0–17.0)
MCH: 31.1 pg (ref 26.0–34.0)
MCHC: 34 g/dL (ref 30.0–36.0)
MCV: 91.7 fL (ref 80.0–100.0)
Platelets: 148 10*3/uL — ABNORMAL LOW (ref 150–400)
RBC: 3.5 MIL/uL — ABNORMAL LOW (ref 4.22–5.81)
RDW: 15.7 % — ABNORMAL HIGH (ref 11.5–15.5)
WBC: 12.2 10*3/uL — ABNORMAL HIGH (ref 4.0–10.5)
nRBC: 0 % (ref 0.0–0.2)

## 2022-07-19 LAB — MAGNESIUM: Magnesium: 1.6 mg/dL — ABNORMAL LOW (ref 1.7–2.4)

## 2022-07-19 MED ORDER — ALUM & MAG HYDROXIDE-SIMETH 200-200-20 MG/5ML PO SUSP
30.0000 mL | ORAL | Status: DC | PRN
  Administered 2022-07-19: 30 mL via ORAL
  Filled 2022-07-19: qty 30

## 2022-07-19 MED ORDER — METHYLPREDNISOLONE SODIUM SUCC 40 MG IJ SOLR
40.0000 mg | INTRAMUSCULAR | Status: DC
  Administered 2022-07-20: 40 mg via INTRAVENOUS
  Filled 2022-07-19: qty 1

## 2022-07-19 MED ORDER — MAGNESIUM SULFATE 2 GM/50ML IV SOLN
2.0000 g | Freq: Once | INTRAVENOUS | Status: AC
  Administered 2022-07-19: 2 g via INTRAVENOUS
  Filled 2022-07-19: qty 50

## 2022-07-19 NOTE — Plan of Care (Signed)

## 2022-07-19 NOTE — Progress Notes (Signed)
PROGRESS NOTE    [name] Stone  UVO:[id number] DOB: March 28, 1960 DOA: 07/15/2022 PCP: Patient, No Pcp Per    Brief Narrative:   [name] Stone is a 62 y.o. male with medical history significant for recent lung cancer diagnosis, tobacco abuse.  Patient was brought to the ED via EMS with reports of generalized weakness, 3 falls over the past 24 hours.  EMS found the patient to be in and out of SVT.  Patient denies palpitation, no chest pain.  Reports some dizziness when standing, and difficulty breathing with exertion.  No lower extremity swelling.  No fevers no chills.  He has chronic cough that has worsened over the past few days, with some rattling. He has had chronic poor oral intake, with weight loss. He was recently diagnosed with metastatic lung cancer, and has been evaluated by Dr. Ellin Saba, he has not started therapy.  Assessment and Plan: * SVT (supraventricular tachycardia) Heart rates up to 180s.  Temporary break with 6 mg and then 12 mg of adenosine, and was back in SVT.  150 mg amiodarone bolus given, currently on amiodarone drip.  Heart rate controlled in the 100s.  No cardiac history.  No chest pain.  No palpitation.  Reports some dizziness, dyspnea on exertion.  Hypokalemia 3.1.  Magnesium 1.7.  Was hypotensive to 70s, with resultant lactic acidosis. -Cardiology ongoing recommendations appreciated and patient on IV amiodarone for now due to inconsistent oral intake and frequent nausea and vomiting -Replete potassium. -Echocardiogram with LVEF 40-45% -Lactic acidosis trending down  Acute bronchitis Dyspnea on exertion, diffuse rhonchi, worsening cough.  Denies diagnosis of COPD.  Ongoing tobacco abuse 1 pack/day.  CTA chest negative for acute abnormality.  O2 sats -DuoNebs as needed and scheduled -Mucolytics as needed -Azithromycin -Continue on IV methylprednisolone as patient is not tolerating much oral intake  AKI (acute kidney injury) (HCC)-resolved Continue to monitor  a.m. labs  Chronic hyponatremia Likely in the setting of poor oral intake with occasional vomiting, continue to monitor  Metastatic cancer [facility]) Following with Dr. Ellin Saba.  New diagnosis of non-small cell lung cancer.  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  CT chest 05/28/22 showed numerous bilateral lung nodules.  Right brain no metastatic disease, PET scan 3/25 - Innumerable bilateral hypermetabolic lung nodules, lymphadenopathy.   -Seen by oncology Dr. Ellin Saba 5/3.  Continue to monitor for any overall improvement and if possible, we will try for palliative chemotherapy otherwise will require hospice evaluation  Intractable nausea and vomiting Schedule Zofran Phenergan as needed Continue IV fluid Trial of soft diet today  DVT prophylaxis: heparin injection 5,000 Units Start: 07/15/22 2200    Code Status: Full Code Family Communication: Daughter at bedside 5/5  Disposition Plan:  Level of care: Stepdown Status is: Inpatient Remains inpatient appropriate because: needs IV meds    Consultants:  Cards Oncology   Subjective: Patient seen and evaluated this a.m. with some ongoing nausea, but no further vomiting.  Tolerating some diet, but appetite remains poor.  Objective: Vitals:   07/19/22 0534 07/19/22 0600 07/19/22 0700 07/19/22 0711  BP:  109/80 134/88   Pulse:  90 87 88  Resp:  (!) 28 (!) 21 19  Temp:    97.8 F (36.6 C)  TempSrc:    Oral  SpO2:  97% 95% 96%  Weight: 54.6 kg     Height:        Intake/Output Summary (Last 24 hours) at 07/19/2022 0958 Last data filed at 07/19/2022 7425 Gross per 24  hour  Intake 3034.47 ml  Output 700 ml  Net 2334.47 ml   Filed Weights   07/17/22 0506 07/18/22 0522 07/19/22 0534  Weight: 48.7 kg 50.7 kg 54.6 kg    Examination:   General: Appearance:    Thin male in no acute distress, somnolent     Lungs:     Rhonchus on right, few expiratory wheezes, respirations unlabored- not on O2  Heart:    Normal heart rate.     MS:   All extremities are intact.    Neurologic:   Awake, alert, oriented x 3. No apparent focal neurological           defect.        Data Reviewed: I have personally reviewed following labs and imaging studies  CBC: Recent Labs  Lab 07/15/22 1506 07/15/22 1521 07/16/22 0520 07/18/22 0418 07/19/22 0348  WBC 11.5*  --  7.8 12.9* 12.2*  HGB 11.7* 11.6* 10.5* 10.4* 10.9*  HCT 34.4* 34.0* 30.4* 30.1* 32.1*  MCV 92.5  --  91.3 90.7 91.7  PLT 178  --  149* 165 148*   Basic Metabolic Panel: Recent Labs  Lab 07/15/22 1506 07/15/22 1521 07/16/22 0520 07/17/22 0357 07/18/22 0418 07/19/22 0348  NA 120* 123* 124* 127* 126* 125*  K 3.1* 4.1 4.3 4.5 3.6 3.7  CL 87* 90* 98 98 97* 96*  CO2 20*  --  19* 16* 21* 22  GLUCOSE 116* 126* 135* 134* 134* 127*  BUN 29* 34* 21* 23* 24* 23*  CREATININE 1.23 1.00 0.75 0.75 0.76 0.86  CALCIUM 7.8*  --  7.7* 8.0* 7.7* 7.4*  MG 1.7  --  2.2  --  1.7 1.6*   GFR: Estimated Creatinine Clearance: 70.5 mL/min (by C-G formula based on SCr of 0.86 mg/dL). Liver Function Tests: No results for input(s): "AST", "ALT", "ALKPHOS", "BILITOT", "PROT", "ALBUMIN" in the last 168 hours. No results for input(s): "LIPASE", "AMYLASE" in the last 168 hours. No results for input(s): "AMMONIA" in the last 168 hours. Coagulation Profile: No results for input(s): "INR", "PROTIME" in the last 168 hours. Cardiac Enzymes: No results for input(s): "CKTOTAL", "CKMB", "CKMBINDEX", "TROPONINI" in the last 168 hours. BNP (last 3 results) No results for input(s): "PROBNP" in the last 8760 hours. HbA1C: No results for input(s): "HGBA1C" in the last 72 hours. CBG: No results for input(s): "GLUCAP" in the last 168 hours. Lipid Profile: No results for input(s): "CHOL", "HDL", "LDLCALC", "TRIG", "CHOLHDL", "LDLDIRECT" in the last 72 hours. Thyroid Function Tests: No results for input(s): "TSH", "T4TOTAL", "FREET4", "T3FREE", "THYROIDAB" in the last 72 hours.  Anemia  Panel: No results for input(s): "VITAMINB12", "FOLATE", "FERRITIN", "TIBC", "IRON", "RETICCTPCT" in the last 72 hours. Sepsis Labs: Recent Labs  Lab 07/15/22 1518 07/15/22 1737 07/15/22 1957  LATICACIDVEN 4.2* 4.5* 2.6*    Recent Results (from the past 240 hour(s))  MRSA Next Gen by PCR, Nasal     Status: None   Collection Time: 07/15/22  8:35 PM   Specimen: Nasal Mucosa; Nasal Swab  Result Value Ref Range Status   MRSA by PCR Next Gen NOT DETECTED NOT DETECTED Final    Comment: (NOTE) The GeneXpert MRSA Assay (FDA approved for NASAL specimens only), is one component of a comprehensive MRSA colonization surveillance program. It is not intended to diagnose MRSA infection nor to guide or monitor treatment for MRSA infections. Test performance is not FDA approved in patients less than 68 years old. Performed at [facility], [address] [place], [place] [address]          Radiology Studies: No results found.      Scheduled Meds:  (feeding supplement) PROSource Plus  30 mL Oral TID with meals   Chlorhexidine Gluconate Cloth  6 each Topical Daily   heparin  5,000 Units Subcutaneous Q8H   ipratropium-albuterol  3 mL Nebulization TID   lactose free nutrition  237 mL Oral TID BM   megestrol  400 mg Oral BID   [START ON 07/20/2022] methylPREDNISolone (SOLU-MEDROL) injection  40 mg Intravenous Q24H   metoprolol succinate  25 mg Oral Daily   multivitamin-lutein  1 capsule Oral Daily   ondansetron (ZOFRAN) IV  4 mg Intravenous Q6H   pantoprazole (PROTONIX) IV  40 mg Intravenous Q24H   sacubitril-valsartan  1 tablet Oral BID   Continuous Infusions:  sodium chloride 75 mL/hr at 07/19/22 9147   amiodarone 30 mg/hr (07/19/22 0722)   azithromycin 500 mg (07/18/22 2211)   promethazine (PHENERGAN) injection (IM or IVPB) Stopped (07/17/22 1506)     LOS: 4 days    Time spent: 45 minutes spent on chart review, discussion with nursing staff, consultants, updating family and  interview/physical exam; more than 50% of that time was spent in counseling and/or coordination of care.    Wilfrid Hyser Hoover Brunette, DO [facility] Available via Epic secure chat 7am-7pm After these hours, please refer to coverage provider listed on [email or web address] 07/19/2022, 9:58 AM

## 2022-07-20 ENCOUNTER — Encounter (HOSPITAL_COMMUNITY): Payer: Self-pay | Admitting: Internal Medicine

## 2022-07-20 ENCOUNTER — Inpatient Hospital Stay: Payer: Medicaid Other

## 2022-07-20 ENCOUNTER — Other Ambulatory Visit: Payer: Self-pay | Admitting: Student

## 2022-07-20 ENCOUNTER — Inpatient Hospital Stay: Payer: Medicaid Other | Admitting: Dietician

## 2022-07-20 DIAGNOSIS — Z7189 Other specified counseling: Secondary | ICD-10-CM

## 2022-07-20 DIAGNOSIS — C3490 Malignant neoplasm of unspecified part of unspecified bronchus or lung: Secondary | ICD-10-CM | POA: Diagnosis not present

## 2022-07-20 DIAGNOSIS — C3491 Malignant neoplasm of unspecified part of right bronchus or lung: Secondary | ICD-10-CM

## 2022-07-20 DIAGNOSIS — Z515 Encounter for palliative care: Secondary | ICD-10-CM

## 2022-07-20 DIAGNOSIS — J209 Acute bronchitis, unspecified: Secondary | ICD-10-CM | POA: Diagnosis not present

## 2022-07-20 DIAGNOSIS — I471 Supraventricular tachycardia, unspecified: Secondary | ICD-10-CM | POA: Diagnosis not present

## 2022-07-20 LAB — CBC
HCT: 30.6 % — ABNORMAL LOW (ref 39.0–52.0)
Hemoglobin: 10.6 g/dL — ABNORMAL LOW (ref 13.0–17.0)
MCH: 31.5 pg (ref 26.0–34.0)
MCHC: 34.6 g/dL (ref 30.0–36.0)
MCV: 90.8 fL (ref 80.0–100.0)
Platelets: 145 10*3/uL — ABNORMAL LOW (ref 150–400)
RBC: 3.37 MIL/uL — ABNORMAL LOW (ref 4.22–5.81)
RDW: 15.8 % — ABNORMAL HIGH (ref 11.5–15.5)
WBC: 16.2 10*3/uL — ABNORMAL HIGH (ref 4.0–10.5)
nRBC: 0 % (ref 0.0–0.2)

## 2022-07-20 LAB — BASIC METABOLIC PANEL
Anion gap: 7 (ref 5–15)
Anion gap: 6 (ref 5–15)
BUN: 23 mg/dL — ABNORMAL HIGH (ref 6–20)
BUN: 22 mg/dL — ABNORMAL HIGH (ref 6–20)
CO2: 20 mmol/L — ABNORMAL LOW (ref 22–32)
CO2: 19 mmol/L — ABNORMAL LOW (ref 22–32)
Calcium: 7.2 mg/dL — ABNORMAL LOW (ref 8.9–10.3)
Calcium: 7.1 mg/dL — ABNORMAL LOW (ref 8.9–10.3)
Chloride: 97 mmol/L — ABNORMAL LOW (ref 98–111)
Chloride: 98 mmol/L (ref 98–111)
Creatinine, Ser: 0.75 mg/dL (ref 0.61–1.24)
Creatinine, Ser: 0.82 mg/dL (ref 0.61–1.24)
GFR, Estimated: >60 mL/min (ref >60)
GFR, Estimated: >60 mL/min (ref >60)
Glucose, Bld: 105 mg/dL — ABNORMAL HIGH (ref 70–99)
Glucose, Bld: 133 mg/dL — ABNORMAL HIGH (ref 70–99)
Potassium: 3.7 mmol/L (ref 3.5–5.1)
Potassium: 4.1 mmol/L (ref 3.5–5.1)
Sodium: 124 mmol/L — ABNORMAL LOW (ref 135–145)
Sodium: 123 mmol/L — ABNORMAL LOW (ref 135–145)

## 2022-07-20 LAB — MAGNESIUM: Magnesium: 2.1 mg/dL (ref 1.7–2.4)

## 2022-07-20 MED ORDER — PREDNISONE 20 MG PO TABS: 40.0000 mg | ORAL_TABLET | Freq: Every day | ORAL | Status: DC

## 2022-07-20 MED ORDER — ONDANSETRON HCL 4 MG PO TABS
4.0000 mg | ORAL_TABLET | Freq: Three times a day (TID) | ORAL | Status: AC
  Administered 2022-07-20 – 2022-07-22 (×7): 4 mg via ORAL
  Filled 2022-07-20 (×8): qty 1

## 2022-07-20 MED ORDER — ORAL CARE MOUTH RINSE: 15.0000 mL | OROMUCOSAL | Status: DC | PRN

## 2022-07-20 MED ORDER — PREDNISONE 20 MG PO TABS
40.0000 mg | ORAL_TABLET | Freq: Every day | ORAL | Status: DC
  Administered 2022-07-21 – 2022-07-22 (×2): 40 mg via ORAL
  Filled 2022-07-20 (×2): qty 2

## 2022-07-20 NOTE — Consult Note (Addendum)
Consultation Note Date: 07/20/2022   Patient Name: [name] Stone  DOB: 12/10/1960  MRN: [id number]  Age / Sex: 62 y.o., male  PCP: Patient, No Pcp Per Referring Physician: Erick Blinks, DO  Reason for Consultation: Establishing goals of care  HPI/Patient Profile: 62 y.o. male  with past medical history of tobacco abuse, recent non-small cell lung cancer diagnosis with metastatic burden to bone followed by Dr. Ellin Saba, had radiation treatment to lower back, was to have Port-A-Cath placed and start palliative chemotherapy, but this has been delayed.  Admitted on 07/15/2022 with SVT, acute bronchitis.   Clinical Assessment and Goals of Care: I have reviewed medical records including EPIC notes, labs and imaging, received report from RN, assessed the patient.  [name] Stone is lying quietly in bed.  He appears acutely/chronically ill and quite frail.  He is resting comfortably, but wakes easily.  He will briefly make and somewhat keep eye contact.  He is alert and oriented x 3, able to make his needs known.  His daughter [name] Stone, son [name] Stone, and mother [name] Stone are present.  We meet at the bedside to discuss diagnosis prognosis, GOC, EOL wishes, disposition and options.  I introduced Palliative Medicine as specialized medical care for people living with serious illness. It focuses on providing relief from the symptoms and stress of a serious illness. The goal is to improve quality of life for both the patient and the family.  We discussed a brief life review of the patient.  [name] Stone is unmarried.  He has 2 children, a daughter [name] Stone and a son [name] Stone.  He worked as a Teacher, music.  He has not worked in some time.  He has been living independently, independent with ADLs/IADLs.  [name] Stone tells me that he has lost approximately 30 pounds since Christmas.  We then focused on their current illness.   We talked about SVT and the treatment plan.  We talk about nausea and vomiting and scheduled Zofran.  We talk about visit with Dr. Ellin Saba and recommendations.  We talk about time for outcomes.  The natural disease trajectory and expectations at EOL were discussed.  Advanced directives, concepts specific to code status, artifical feeding and hydration, and rehospitalization were considered and discussed.  [name] Stone readily agrees to DNR.  His family states that they can abide this wish.  Hospice Care services outpatient were explained and offered.  We talked about the benefits of at home hospice care.  We talk about what is and is not provided.  At this point patient and family are open to hospice care if no improvement.  We did not discuss residential hospice today.  Discussed the importance of continued conversation with family and the medical providers regarding overall plan of care and treatment options, ensuring decisions are within the context of the patient's values and GOCs.  Questions and concerns were addressed.  The patient and family were encouraged to call with questions Stone concerns.  PMT will continue to support holistically.  HCPOA   HCPOA -son and daughter are healthcare agents.  Completed last week.  Electronic copy not available in chart.    SUMMARY OF RECOMMENDATIONS   At this point continue to treat the treatable but no CPR Stone intubation. Time for outcomes. Open to hospice care if no improvement.    Code Status/Advance Care Planning: DNR -treat the treatable but allowing natural passing.  Family states they can accept his wish.  Symptom Management:  Per hospitalist, added scheduled Zofran.  Palliative Prophylaxis:  Frequent Pain Assessment, Oral Care, Palliative Wound Care, and Turn Reposition  Additional Recommendations (Limitations, Scope, Preferences): Continue to treat but no CPR Stone intubation.  Psycho-social/Spiritual:  Desire for further Chaplaincy  support:no Additional Recommendations: Caregiving  Support/Resources and Education on Hospice  Prognosis:  Unable to determine, based on outcomes.  Guarded at this point.  2 to 10 weeks would not be surprising.  Discharge Planning: To Be Determined      Primary Diagnoses: Present on Admission:  SVT (supraventricular tachycardia)  Hyponatremia  Hypokalemia  AKI (acute kidney injury) (HCC)  Metastatic cancer (HCC)  Acute bronchitis  Adenocarcinoma of right lung (HCC)  Lactic acidosis   I have reviewed the medical record, interviewed the patient and family, and examined the patient. The following aspects are pertinent.  Past Medical History:  Diagnosis Date   Cancer (HCC)    Tobacco abuse    Social History   Socioeconomic History   Marital status: Married    Spouse name: Not on file   Number of children: Not on file   Years of education: Not on file   Highest education level: Not on file  Occupational History   Not on file  Tobacco Use   Smoking status: Every Day    Packs/day: 1    Types: Cigarettes   Smokeless tobacco: Not on file  Substance and Sexual Activity   Alcohol use: Not on file   Drug use: Not on file   Sexual activity: Not on file  Other Topics Concern   Not on file  Social History Narrative   Not on file   Social Determinants of Health   Financial Resource Strain: Not on file  Food Insecurity: No Food Insecurity (07/15/2022)   Hunger Vital Sign    Worried About Running Out of Food in the Last Year: Never true    Ran Out of Food in the Last Year: Never true  Transportation Needs: No Transportation Needs (07/15/2022)   PRAPARE - Administrator, Civil Service (Medical): No    Lack of Transportation (Non-Medical): No  Physical Activity: Not on file  Stress: Not on file  Social Connections: Not on file   History reviewed. No pertinent family history. Scheduled Meds:  (feeding supplement) PROSource Plus  30 mL Oral TID with meals    Chlorhexidine Gluconate Cloth  6 each Topical Daily   heparin  5,000 Units Subcutaneous Q8H   ipratropium-albuterol  3 mL Nebulization TID   lactose free nutrition  237 mL Oral TID BM   megestrol  400 mg Oral BID   metoprolol succinate  25 mg Oral Daily   multivitamin-lutein  1 capsule Oral Daily   ondansetron  4 mg Oral Q8H   pantoprazole (PROTONIX) IV  40 mg Intravenous Q24H   [START ON 07/21/2022] predniSONE  40 mg Oral Q breakfast   sacubitril-valsartan  1 tablet Oral BID   Continuous Infusions:  sodium chloride 75 mL/hr at 07/20/22 0841   amiodarone 30  mg/hr (07/20/22 0841)   promethazine (PHENERGAN) injection (IM Stone IVPB) Stopped (07/17/22 1506)   PRN Meds:.acetaminophen **Stone** acetaminophen, alum & mag hydroxide-simeth, hydrALAZINE, ipratropium-albuterol, labetalol, mouth rinse, polyethylene glycol, prochlorperazine, promethazine (PHENERGAN) injection (IM Stone IVPB) Medications Prior to Admission:  Prior to Admission medications   Medication Sig Start Date End Date Taking? Authorizing Provider  folic acid (FOLVITE) 1 MG tablet Take 1 tablet (1 mg total) by mouth daily. 07/13/22  Yes Doreatha Massed, MD  megestrol (MEGACE) 400 MG/10ML suspension Take 10 mLs (400 mg total) by mouth 2 (two) times daily. 07/13/22  Yes Doreatha Massed, MD  ibuprofen (ADVIL) 200 MG tablet Take 400 mg by mouth every 6 (six) hours as needed for moderate pain. Patient not taking: Reported on 07/15/2022    [provider]  tamsulosin (FLOMAX) 0.4 MG CAPS capsule Take 1 capsule (0.4 mg total) by mouth daily after supper. Patient not taking: Reported on 07/15/2022 06/08/22   Doreatha Massed, MD   Not on File Review of Systems  Unable to perform ROS: Acuity of condition    Physical Exam Vitals and nursing note reviewed.  Constitutional:      General: He is not in acute distress.    Appearance: He is ill-appearing.  Cardiovascular:     Rate and Rhythm: Normal rate.  Pulmonary:      Effort: Pulmonary effort is normal. No respiratory distress.  Skin:    General: Skin is warm and dry.  Neurological:     Mental Status: He is alert and oriented to person, place, and time.  Psychiatric:        Mood and Affect: Mood normal.        Behavior: Behavior normal.     Vital Signs: BP 118/66 (BP Location: Left Arm)   Pulse 86   Temp 97.9 F (36.6 C) (Oral)   Resp 19   Ht 5\' 6"  (1.676 m)   Wt 56.7 kg   SpO2 97%   BMI 20.18 kg/m  Pain Scale: 0-10 POSS *See Group Information*: S-Acceptable,Sleep, easy to arouse Pain Score: 0-No pain   SpO2: SpO2: 97 % O2 Device:SpO2: 97 % O2 Flow Rate: .O2 Flow Rate (L/min): 2 L/min  IO: Intake/output summary:  Intake/Output Summary (Last 24 hours) at 07/20/2022 1132 Last data filed at 07/20/2022 1115 Gross per 24 hour  Intake 2922.38 ml  Output 775 ml  Net 2147.38 ml    LBM: Last BM Date : 07/16/22 Baseline Weight: Weight: 46.5 kg Most recent weight: Weight: 56.7 kg     Palliative Assessment/Data:     Time In: 0910 Time Out: 1025 Time Total: 75 minutes  Greater than 50%  of this time was spent counseling and coordinating care related to the above assessment and plan.  Signed by: Katheran Awe, NP   Please contact Palliative Medicine Team phone at [phone] for questions and concerns.  For individual provider: See Loretha Stapler

## 2022-07-20 NOTE — [place] FL2 (Signed)
[place] MEDICAID FL2 LEVEL OF CARE FORM     IDENTIFICATION  Patient Name: [name] Stone Birthdate: 05-08-1960 Sex: male Admission Date (Current Location): 07/15/2022  [facility] and [place] Number:  [employer] and Address:  [facility],  [address], [place] [address]      Provider Number: [id number]  Attending Physician Name and Address:  Erick Blinks, DO  Relative Name and Phone Number:       Current Level of Care: Hospital Recommended Level of Care: Skilled Nursing Facility Prior Approval Number:    Date Approved/Denied:   PASRR Number: [id number]  Discharge Plan: SNF    Current Diagnoses: Patient Active Problem List   Diagnosis Date Noted   Primary malignant neoplasm of lung metastatic to other site (HCC) 07/17/2022   Protein-calorie malnutrition, severe 07/16/2022   SVT (supraventricular tachycardia) 07/15/2022   Hyponatremia 07/15/2022   Hypokalemia 07/15/2022   AKI (acute kidney injury) (HCC) 07/15/2022   Acute bronchitis 07/15/2022   Lactic acidosis 07/15/2022   Adenocarcinoma of right lung (HCC) 07/13/2022   Metastatic cancer (HCC) 06/07/2022    Orientation RESPIRATION BLADDER Height & Weight     Self, Time, Situation, Place  Normal Continent Weight: 125 lb (56.7 kg) Height:  5\' 6"  (167.6 cm)  BEHAVIORAL SYMPTOMS/MOOD NEUROLOGICAL BOWEL NUTRITION STATUS      Continent Diet (See D/C summary)  AMBULATORY STATUS COMMUNICATION OF NEEDS Skin   Extensive Assist Verbally                         Personal Care Assistance Level of Assistance  Bathing, Feeding, Dressing Bathing Assistance: Limited assistance Feeding assistance: Independent Dressing Assistance: Limited assistance     Functional Limitations Info  Sight, Hearing, Speech Sight Info: Adequate Hearing Info: Adequate Speech Info: Adequate    SPECIAL CARE FACTORS FREQUENCY  PT (By licensed PT), OT (By licensed OT)     PT Frequency: 5 times weekly OT  Frequency: 5 times weekly            Contractures Contractures Info: Not present    Additional Factors Info  Code Status, Allergies Code Status Info: DNR Allergies Info: NKA           Current Medications (07/20/2022):  This is the current hospital active medication list Current Facility-Administered Medications  Medication Dose Route Frequency Provider Last Rate Last Admin   (feeding supplement) PROSource Plus liquid 30 mL  30 mL Oral TID with meals Benjamine Mola, Jessica U, DO   30 mL at 07/20/22 1133   0.9 %  sodium chloride infusion   Intravenous Continuous Maurilio Lovely D, DO 75 mL/hr at 07/20/22 0841 Infusion Verify at 07/20/22 0841   acetaminophen (TYLENOL) tablet 650 mg  650 mg Oral Q6H PRN Emokpae, Ejiroghene E, MD   650 mg at 07/19/22 1255   Or   acetaminophen (TYLENOL) suppository 650 mg  650 mg Rectal Q6H PRN Emokpae, Ejiroghene E, MD       alum & mag hydroxide-simeth (MAALOX/MYLANTA) 200-200-20 MG/5ML suspension 30 mL  30 mL Oral Q4H PRN Sherryll Burger, Pratik D, DO   30 mL at 07/19/22 1953   amiodarone (NEXTERONE PREMIX) 360-4.14 MG/200ML-% (1.8 mg/mL) IV infusion  30 mg/hr Intravenous Continuous Maurilio Lovely D, DO 16.67 mL/hr at 07/20/22 0841 30 mg/hr at 07/20/22 0841   Chlorhexidine Gluconate Cloth 2 % PADS 6 each  6 each Topical Daily Maurilio Lovely D, DO   6 each at 07/20/22 [id number]  heparin injection 5,000 Units  5,000 Units Subcutaneous Q8H Emokpae, Ejiroghene E, MD   5,000 Units at 07/20/22 0529   hydrALAZINE (APRESOLINE) injection 5 mg  5 mg Intravenous Q6H PRN Marlin Canary U, DO   5 mg at 07/19/22 1255   ipratropium-albuterol (DUONEB) 0.5-2.5 (3) MG/3ML nebulizer solution 3 mL  3 mL Nebulization Q4H PRN Emokpae, Ejiroghene E, MD       ipratropium-albuterol (DUONEB) 0.5-2.5 (3) MG/3ML nebulizer solution 3 mL  3 mL Nebulization TID Sherryll Burger, Pratik D, DO   3 mL at 07/20/22 0802   labetalol (NORMODYNE) injection 5 mg  5 mg Intravenous Q2H PRN Marlin Canary U, DO   5 mg at 07/17/22 1719    lactose free nutrition (BOOST PLUS) liquid 237 mL  237 mL Oral TID BM Vann, Jessica U, DO   237 mL at 07/20/22 0925   megestrol (MEGACE) 400 MG/10ML suspension 400 mg  400 mg Oral BID Emokpae, Ejiroghene E, MD   400 mg at 07/20/22 0924   metoprolol succinate (TOPROL-XL) 24 hr tablet 25 mg  25 mg Oral Daily Antoine Poche, MD   25 mg at 07/20/22 4098   multivitamin-lutein (OCUVITE-LUTEIN) capsule 1 capsule  1 capsule Oral Daily Marlin Canary U, DO   1 capsule at 07/20/22 0924   ondansetron (ZOFRAN) tablet 4 mg  4 mg Oral Q8H Dove, Tasha A, NP   4 mg at 07/20/22 1132   Oral care mouth rinse  15 mL Mouth Rinse PRN Sherryll Burger, Pratik D, DO       pantoprazole (PROTONIX) injection 40 mg  40 mg Intravenous Q24H Sherryll Burger, Pratik D, DO   40 mg at 07/20/22 1133   polyethylene glycol (MIRALAX / GLYCOLAX) packet 17 g  17 g Oral Daily PRN Emokpae, Ejiroghene E, MD       [START ON 07/21/2022] predniSONE (DELTASONE) tablet 40 mg  40 mg Oral Q breakfast Shah, Pratik D, DO       prochlorperazine (COMPAZINE) injection 10 mg  10 mg Intravenous Q6H PRN Adefeso, Oladapo, DO   10 mg at 07/17/22 2114   promethazine (PHENERGAN) 25 mg in sodium chloride 0.9 % 50 mL IVPB  25 mg Intravenous Q6H PRN Sherryll Burger, Pratik D, DO   Stopped at 07/17/22 1506   sacubitril-valsartan (ENTRESTO) 49-51 mg per tablet  1 tablet Oral BID Antoine Poche, MD   1 tablet at 07/20/22 1191     Discharge Medications: Please see discharge summary for a list of discharge medications.  Relevant Imaging Results:  Relevant Lab Results:   Additional Information SSN: [id number], [place]

## 2022-07-20 NOTE — Plan of Care (Signed)

## 2022-07-20 NOTE — Progress Notes (Addendum)
Progress Note  Patient Name: [name] Stone Date of Encounter: 07/20/2022  Primary Cardiologist: Dina Rich, MD  Subjective   Nausea and breathing a little better today. No major complaints.  Inpatient Medications    Scheduled Meds:  (feeding supplement) PROSource Plus  30 mL Oral TID with meals   Chlorhexidine Gluconate Cloth  6 each Topical Daily   heparin  5,000 Units Subcutaneous Q8H   ipratropium-albuterol  3 mL Nebulization TID   lactose free nutrition  237 mL Oral TID BM   megestrol  400 mg Oral BID   methylPREDNISolone (SOLU-MEDROL) injection  40 mg Intravenous Q24H   metoprolol succinate  25 mg Oral Daily   multivitamin-lutein  1 capsule Oral Daily   ondansetron (ZOFRAN) IV  4 mg Intravenous Q6H   pantoprazole (PROTONIX) IV  40 mg Intravenous Q24H   sacubitril-valsartan  1 tablet Oral BID   Continuous Infusions:  sodium chloride 75 mL/hr at 07/20/22 0841   amiodarone 30 mg/hr (07/20/22 0841)   azithromycin 500 mg (07/19/22 2052)   promethazine (PHENERGAN) injection (IM or IVPB) Stopped (07/17/22 1506)   PRN Meds: acetaminophen **OR** acetaminophen, alum & mag hydroxide-simeth, hydrALAZINE, ipratropium-albuterol, labetalol, mouth rinse, polyethylene glycol, prochlorperazine, promethazine (PHENERGAN) injection (IM or IVPB)   Vital Signs    Vitals:   07/20/22 0534 07/20/22 0600 07/20/22 0749 07/20/22 0803  BP:  105/63  118/66  Pulse:  81  86  Resp:  (!) 23  19  Temp:   98.2 F (36.8 C)   TempSrc:   Oral   SpO2:  92%  97%  Weight: 56.7 kg     Height:        Intake/Output Summary (Last 24 hours) at 07/20/2022 0932 Last data filed at 07/20/2022 0841 Gross per 24 hour  Intake 3162.38 ml  Output 975 ml  Net 2187.38 ml      07/20/2022    5:34 AM 07/19/2022    5:34 AM 07/18/2022    5:22 AM  Last 3 Weights  Weight (lbs) 125 lb 120 lb 5.9 oz 111 lb 12.4 oz  Weight (kg) 56.7 kg 54.6 kg 50.7 kg     Telemetry    NSR, only 2 brief runs of SVT in last 24 hours  - max 7 beats. Also one PVC triplet - Personally Reviewed  ECG    No new tracings - Personally Reviewed  Physical Exam   GEN: No acute distress. Cachectic appearing. HEENT: Normocephalic, atraumatic, sclera non-icteric. Neck: No JVD or bruits. Cardiac: RRR no murmurs, rubs, or gallops.  Respiratory: Diffusely diminished with end expiratrory wheezing. Breathing is unlabored. GI: Soft, nontender, non-distended, BS +x 4. MS: no deformity. Extremities: No clubbing or cyanosis. No edema. Distal pedal pulses are 2+ and equal bilaterally. Neuro:  AAOx3. Follows commands. Psych:  Responds to questions appropriately with a flattened affect.  Labs    High Sensitivity Troponin:   Recent Labs  Lab 07/15/22 1506 07/15/22 1737  TROPONINIHS 54* 54*      Cardiac EnzymesNo results for input(s): "TROPONINI" in the last 168 hours. No results for input(s): "TROPIPOC" in the last 168 hours.   Chemistry Recent Labs  Lab 07/18/22 0418 07/19/22 0348 07/20/22 0341  NA 126* 125* 124*  K 3.6 3.7 3.7  CL 97* 96* 97*  CO2 21* 22 20*  GLUCOSE 134* 127* 105*  BUN 24* 23* 23*  CREATININE 0.76 0.86 0.75  CALCIUM 7.7* 7.4* 7.2*  GFRNONAA >60 >60 >60  ANIONGAP 8 7 7  Hematology Recent Labs  Lab 07/18/22 0418 07/19/22 0348 07/20/22 0341  WBC 12.9* 12.2* 16.2*  RBC 3.32* 3.50* 3.37*  HGB 10.4* 10.9* 10.6*  HCT 30.1* 32.1* 30.6*  MCV 90.7 91.7 90.8  MCH 31.3 31.1 31.5  MCHC 34.6 34.0 34.6  RDW 15.7* 15.7* 15.8*  PLT 165 148* 145*    BNPNo results for input(s): "BNP", "PROBNP" in the last 168 hours.   DDimer  Recent Labs  Lab 07/15/22 1541  DDIMER 5.27*     Radiology    No results found.  Cardiac Studies   2d echo 07/16/22   1. Left ventricular ejection fraction, by estimation, is 40 to 45%. The  left ventricle has mildly decreased function. The left ventricle  demonstrates global hypokinesis. Left ventricular diastolic parameters are  indeterminate.   2. Right  ventricular systolic function is normal. The right ventricular  size is normal. There is moderately elevated pulmonary artery systolic  pressure.   3. The mitral valve was not well visualized. No evidence of mitral valve  regurgitation. No evidence of mitral stenosis.   4. The tricuspid valve is abnormal. Tricuspid valve regurgitation is  moderate.   5. The aortic valve was not well visualized. Aortic valve regurgitation  is not visualized. No aortic stenosis is present.   6. Pulmonic valve regurgitation not able to assess. not able to assess  pulmonic stenosis.   7. The inferior vena cava is dilated in size with >50% respiratory  variability, suggesting right atrial pressure of 8 mmHg.   8. Technically difficult study   Patient Profile     62 y.o. male witith recent diagnosis of metastatic lung CA, tobacco abuse presented with weakness/tachycardia, found to have SVT, severe hyponatremia, hypokalemia, relative hypomagnesemia, mildly elevated troponin, elevated d-dimer. CTA without PE, overall slight interval increase in pulmonary metastases, small right pleural effusion, osseous metastatic disease, aortic atherosclerosis. Also suspected COPD by imaging.   Assessment & Plan    1. PSVT with hypotension on admission in setting of complex medical illness - initially tx with amiodarone drip, then transitioned to oral on 5/2, though back to IV on 5/3 due to inconsistent oral intake and n/v - nausea somewhat improved - will discuss transition back to oral amiodarone today with MD - continue Toprol - lyte mgmt per IM - TSH wnl  2. 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 Metastatic lung CA complicated by hyponatremia, intractable n/v, AKI, acute bronchitis - management per IM, onc - per notes, "if possible, we will try for palliative chemotherapy otherwise will require hospice evaluation"  For questions or updates, please contact [facility] Please consult [email or web address] for contact info under Cardiology/STEMI.  Signed, Laurann Montana, PA-C 07/20/2022, 9:32 AM    Patient seen and examined   I agree with findings as noted by D Dunn above Pt comfortable sitting in chair having lunch    Neck  JVP is not elevated  Lungs moving air  though decreased   No rales   Cardiac exam   RRR  No S3    Ext are without signficiant edema  Tele:   SR  Short burst SVT  SVT On admit HR 220  Broke with 6 and then 6 of adenosine but went back into SVT. 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  Not an ablation candidate   HFrEF  No Hx of CP   Volume status is not back   On Toprol and Entresto  Toleraing   Hold other meds   Follow volume closely, diurese as needed.    Dietrich Pates MD

## 2022-07-20 NOTE — Consult Note (Signed)
[facility] Oncology Progress Note  Name: [name] Stone      MRN: [id number]    Location: IC02/IC02-01  Date: 07/20/2022 Time:5:45 PM   Subjective: Interval History:[name] Stone is seen today.  He is lying in the bed with his daughter at bedside.  He reports that nausea is better and he is able to eat well.  Heart rate is better controlled.  Objective: Vital signs in last 24 hours: Temp:  [97.9 F (36.6 C)-98.3 F (36.8 C)] 98.2 F (36.8 C) (05/06 1630) Pulse Rate:  [77-99] 80 (05/06 1400) Resp:  [18-38] 18 (05/06 1400) BP: (96-158)/(56-103) 98/57 (05/06 1400) SpO2:  [92 %-99 %] 93 % (05/06 1400) Weight:  [125 lb (56.7 kg)] 125 lb (56.7 kg) (05/06 0534)    Intake/Output from previous day: 05/05 0701 - 05/06 0700 In: 2878.7 [P.O.:480; I.V.:2121.4] Out: 850 [Urine:850]    Intake/Output this shift: Total I/O In: 1260.8 [P.O.:240; I.V.:1020.8] Out: 175 [Urine:175]   PHYSICAL EXAM: BP (!) 98/57   Pulse 80   Temp 98.2 F (36.8 C) (Oral)   Resp 18   Ht 5\' 6"  (1.676 m)   Wt 125 lb (56.7 kg)   SpO2 93%   BMI 20.18 kg/m  General appearance: alert, cooperative, and appears stated age Neurologic: Grossly normal   Studies/Results: Results for orders placed or performed during the hospital encounter of 07/15/22 (from the past 48 hour(s))  Basic metabolic panel     Status: Abnormal   Collection Time: 07/19/22  3:48 AM  Result Value Ref Range   Sodium 125 (L) 135 - 145 mmol/L   Potassium 3.7 3.5 - 5.1 mmol/L   Chloride 96 (L) 98 - 111 mmol/L   CO2 22 22 - 32 mmol/L   Glucose, Bld 127 (H) 70 - 99 mg/dL    Comment: Glucose reference range applies only to samples taken after fasting for at least 8 hours.   BUN 23 (H) 6 - 20 mg/dL   Creatinine, Ser 7.82 0.61 - 1.24 mg/dL   Calcium 7.4 (L) 8.9 - 10.3 mg/dL   GFR, Estimated >95 >62 mL/min    Comment: (NOTE) Calculated using the CKD-EPI Creatinine Equation (2021)    Anion gap 7 5 - 15    Comment: Performed at [facility], [address] [place], [place] [address]  Magnesium     Status: Abnormal   Collection Time: 07/19/22  3:48 AM  Result Value Ref Range   Magnesium 1.6 (L) 1.7 - 2.4 mg/dL    Comment: Performed at [facility], [address] [place], [place] [address]  CBC     Status: Abnormal   Collection Time: 07/19/22  3:48 AM  Result Value Ref Range   WBC 12.2 (H) 4.0 - 10.5 K/uL   RBC 3.50 (L) 4.22 - 5.81 MIL/uL   Hemoglobin 10.9 (L) 13.0 - 17.0 g/dL   HCT 96.2 (L) 95.2 - 84.1 %   MCV 91.7 80.0 - 100.0 fL   MCH 31.1 26.0 - 34.0 pg   MCHC 34.0 30.0 - 36.0 g/dL   RDW 32.4 (H) 40.1 - 02.7 %   Platelets 148 (L) 150 - 400 K/uL   nRBC 0.0 0.0 - 0.2 %    Comment: Performed at [facility], [address] [place], [place] [address]  CBC     Status: Abnormal   Collection Time: 07/20/22  3:41 AM  Result Value Ref Range   WBC 16.2 (H) 4.0 - 10.5 K/uL  RBC 3.37 (L) 4.22 - 5.81 MIL/uL   Hemoglobin 10.6 (L) 13.0 - 17.0 g/dL   HCT 16.1 (L) 09.6 - 04.5 %   MCV 90.8 80.0 - 100.0 fL   MCH 31.5 26.0 - 34.0 pg   MCHC 34.6 30.0 - 36.0 g/dL   RDW 40.9 (H) 81.1 - 91.4 %   Platelets 145 (L) 150 - 400 K/uL   nRBC 0.0 0.0 - 0.2 %    Comment: Performed at [facility], [address] [place], [place] [address]  Magnesium     Status: None   Collection Time: 07/20/22  3:41 AM  Result Value Ref Range   Magnesium 2.1 1.7 - 2.4 mg/dL    Comment: Performed at [facility], [address] [place], [place] [address]  Basic metabolic panel     Status: Abnormal   Collection Time: 07/20/22  3:41 AM  Result Value Ref Range   Sodium 124 (L) 135 - 145 mmol/L   Potassium 3.7 3.5 - 5.1 mmol/L   Chloride 97 (L) 98 - 111 mmol/L   CO2 20 (L) 22 - 32 mmol/L   Glucose, Bld 105 (H) 70 - 99 mg/dL    Comment: Glucose reference range applies only to samples taken after fasting for at least 8 hours.   BUN 23 (H) 6 - 20 mg/dL   Creatinine, Ser 8.65 0.61 - 1.24 mg/dL   Calcium 7.2 (L) 8.9 - 10.3 mg/dL   GFR, Estimated >78  >46 mL/min    Comment: (NOTE) Calculated using the CKD-EPI Creatinine Equation (2021)    Anion gap 7 5 - 15    Comment: Performed at [facility], [address], [place], [place] [address]  Basic metabolic panel     Status: Abnormal   Collection Time: 07/20/22 10:41 AM  Result Value Ref Range   Sodium 123 (L) 135 - 145 mmol/L   Potassium 4.1 3.5 - 5.1 mmol/L   Chloride 98 98 - 111 mmol/L   CO2 19 (L) 22 - 32 mmol/L   Glucose, Bld 133 (H) 70 - 99 mg/dL    Comment: Glucose reference range applies only to samples taken after fasting for at least 8 hours.   BUN 22 (H) 6 - 20 mg/dL   Creatinine, Ser 2.84 0.61 - 1.24 mg/dL   Calcium 7.1 (L) 8.9 - 10.3 mg/dL   GFR, Estimated >13 >24 mL/min    Comment: (NOTE) Calculated using the CKD-EPI Creatinine Equation (2021)    Anion gap 6 5 - 15    Comment: Performed at [facility], [address], [place], [place] [address]   No results found.   MEDICATIONS: I have reviewed the patient's current medications.     Assessment/Plan:  1.  Metastatic adenocarcinoma of the lung: - We have discussed options including best supportive care in the form of hospice versus active therapy. - He would like to try treatment if his overall condition improves in the next few days.  2.  SVT: - Heart rate has improved.  He will be switched to oral amiodarone tomorrow per patient.  3.  Intractable nausea/vomiting: - This has improved since Friday.  He has not thrown up in the last 2 days.  He is able to eat well.  4.  Acute bronchitis: - Continue azithromycin and oral steroids.   All questions were answered. The patient knows to call the clinic with any problems, questions or concerns. We can certainly see the patient much sooner if necessary.  Derek Jack

## 2022-07-20 NOTE — TOC Initial Note (Signed)
Transition of Care [facility]) - Initial/Assessment Note    Patient Details  Name: [name] Stone MRN: [id number] Date of Birth: December 16, 1960  Transition of Care [facility]) CM/SW Contact:    Elliot Gault, LCSW Phone Number: 07/20/2022, 1:55 PM  Clinical Narrative:                  Pt from home. PT recommending SNF rehab at dc. TOC spoke with pt and son to review dc planning. They are agreeable to SNF referrals. CMS provider options reviewed. Will refer as requested.  TOC will follow.  Expected Discharge Plan: Skilled Nursing Facility Barriers to Discharge: Continued Medical Work up   Patient Goals and CMS Choice Patient states their goals for this hospitalization and ongoing recovery are:: rehab CMS Medicare.gov Compare Post Acute Care list provided to:: Patient Choice offered to / list presented to : Patient      Expected Discharge Plan and Services In-house Referral: Clinical Social Work   Post Acute Care Choice: Skilled Nursing Facility Living arrangements for the past 2 months: Single Family Home                                      Prior Living Arrangements/Services Living arrangements for the past 2 months: Single Family Home Lives with:: Spouse Patient language and need for interpreter reviewed:: Yes Do you feel safe going back to the place where you live?: Yes      Need for Family Participation in Patient Care: Yes (Comment) Care giver support system in place?: Yes (comment)   Criminal Activity/Legal Involvement Pertinent to Current Situation/Hospitalization: No - Comment as needed  Activities of Daily Living Home Assistive Devices/Equipment: None ADL Screening (condition at time of admission) Patient's cognitive ability adequate to safely complete daily activities?: Yes Is the patient deaf or have difficulty hearing?: No Does the patient have difficulty seeing, even when wearing glasses/contacts?: No Does the patient have difficulty concentrating, remembering,  or making decisions?: No Patient able to express need for assistance with ADLs?: Yes Does the patient have difficulty dressing or bathing?: No Independently performs ADLs?: No Communication: Independent Dressing (OT): Needs assistance Is this a change from baseline?: Change from baseline, expected to last <3days Grooming: Independent Feeding: Independent Bathing: Needs assistance Is this a change from baseline?: Change from baseline, expected to last <3 days Toileting: Needs assistance Is this a change from baseline?: Change from baseline, expected to last <3 days In/Out Bed: Needs assistance Is this a change from baseline?: Change from baseline, expected to last <3 days Walks in Home: Independent Does the patient have difficulty walking or climbing stairs?: No Weakness of Legs: None Weakness of Arms/Hands: None  Permission Sought/Granted Permission sought to share information with : Oceanographer granted to share information with : Yes, Verbal Permission Granted     Permission granted to share info w AGENCY: [facility]        Emotional Assessment   Attitude/Demeanor/Rapport: Engaged Affect (typically observed): Pleasant Orientation: : Oriented to Self, Oriented to Place, Oriented to  Time, Oriented to Situation Alcohol / Substance Use: Not Applicable Psych Involvement: No (comment)  Admission diagnosis:  Hypokalemia [E87.6] Hyponatremia [E87.1] SVT (supraventricular tachycardia) [I47.10] Failure to thrive in adult [R62.7] Primary malignant neoplasm of lung metastatic to other site, unspecified laterality [facility]) [C34.90] Patient Active Problem List   Diagnosis Date Noted   Primary malignant neoplasm of lung metastatic to other  site [facility]) 07/17/2022   Protein-calorie malnutrition, severe 07/16/2022   SVT (supraventricular tachycardia) 07/15/2022   Hyponatremia 07/15/2022   Hypokalemia 07/15/2022   AKI (acute kidney injury) (HCC) 07/15/2022   Acute  bronchitis 07/15/2022   Lactic acidosis 07/15/2022   Adenocarcinoma of right lung (HCC) 07/13/2022   Metastatic cancer (HCC) 06/07/2022   PCP:  Patient, No Pcp Per Pharmacy:   [facility] - [place], [place] - [address] [place] [address] Phone: [phone] Fax: [phone]     Social Determinants of Health (SDOH) Social History: SDOH Screenings   Food Insecurity: No Food Insecurity (07/15/2022)  Housing: Low Risk  (07/15/2022)  Transportation Needs: No Transportation Needs (07/15/2022)  Utilities: Not At Risk (07/15/2022)  Depression (PHQ2-9): Low Risk  (06/08/2022)  Tobacco Use: High Risk (07/20/2022)   SDOH Interventions:     Readmission Risk Interventions     No data to display

## 2022-07-20 NOTE — Progress Notes (Signed)
PROGRESS NOTE    [name] Stone  ZOX:[id number] DOB: 07-Jun-1960 DOA: 07/15/2022 PCP: Patient, No Pcp Per    Brief Narrative:   [name] Stone is a 62 y.o. male with medical history significant for recent lung cancer diagnosis, tobacco abuse.  Patient was brought to the ED via EMS with reports of generalized weakness, 3 falls over the past 24 hours.  EMS found the patient to be in and out of SVT.  Patient denies palpitation, no chest pain.  Reports some dizziness when standing, and difficulty breathing with exertion.  No lower extremity swelling.  No fevers no chills.  He has chronic cough that has worsened over the past few days, with some rattling. He has had chronic poor oral intake, with weight loss. He was recently diagnosed with metastatic lung cancer, and has been evaluated by Dr. Ellin Saba, he has not started therapy.  Assessment and Plan: * SVT (supraventricular tachycardia) Heart rates up to 180s.  Temporary break with 6 mg and then 12 mg of adenosine, and was back in SVT.  150 mg amiodarone bolus given, currently on amiodarone drip.  Heart rate controlled in the 100s.  No cardiac history.  No chest pain.  No palpitation.  Reports some dizziness, dyspnea on exertion.  Hypokalemia 3.1.  Magnesium 1.7.  Was hypotensive to 70s, with resultant lactic acidosis. -Cardiology ongoing recommendations appreciated and patient on IV amiodarone for now due to inconsistent oral intake and frequent nausea and vomiting, per cardiology, will try to transition back to oral dosing if tolerated -Replete potassium. -Echocardiogram with LVEF 40-45% -Lactic acidosis trending down  Acute bronchitis Dyspnea on exertion, diffuse rhonchi, worsening cough.  Denies diagnosis of COPD.  Ongoing tobacco abuse 1 pack/day.  CTA chest negative for acute abnormality.  O2 sats -DuoNebs as needed and scheduled -Mucolytics as needed -Azithromycin -Switch to oral prednisone for 3 more days  AKI (acute kidney injury)  (HCC)-resolved Continue to monitor a.m. labs  Chronic hyponatremia Likely in the setting of poor oral intake with occasional vomiting, continue to monitor  Metastatic cancer [facility]) Following with Dr. Ellin Saba.  New diagnosis of non-small cell lung cancer.  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  CT chest 05/28/22 showed numerous bilateral lung nodules.  Right brain no metastatic disease, PET scan 3/25 - Innumerable bilateral hypermetabolic lung nodules, lymphadenopathy.   -Seen by oncology Dr. Ellin Saba 5/3.  Continue to monitor for any overall improvement and if possible, we will try for palliative chemotherapy otherwise will require hospice evaluation  Intractable nausea and vomiting Schedule Zofran Phenergan as needed Continue IV fluid Trial of soft diet today  DVT prophylaxis: heparin injection 5,000 Units Start: 07/15/22 2200    Code Status: DNR Family Communication: Daughter at bedside 5/5  Disposition Plan:  Level of care: Stepdown Status is: Inpatient Remains inpatient appropriate because: needs IV meds    Consultants:  Cards Oncology   Subjective: Patient seen and evaluated this a.m. with some ongoing nausea, but no further vomiting.  Tolerating some diet, but appetite remains poor.  Objective: Vitals:   07/20/22 0534 07/20/22 0600 07/20/22 0749 07/20/22 0803  BP:  105/63  118/66  Pulse:  81  86  Resp:  (!) 23  19  Temp:   98.2 F (36.8 C)   TempSrc:   Oral   SpO2:  92%  97%  Weight: 56.7 kg     Height:        Intake/Output Summary (Last 24 hours) at 07/20/2022 1028 Last data filed  at 07/20/2022 0841 Gross per 24 hour  Intake 3162.38 ml  Output 975 ml  Net 2187.38 ml   Filed Weights   07/18/22 0522 07/19/22 0534 07/20/22 0534  Weight: 50.7 kg 54.6 kg 56.7 kg    Examination:   General: Appearance:    Thin male in no acute distress, somnolent     Lungs:     Rhonchus on right, few expiratory wheezes, respirations unlabored- not on O2  Heart:     Normal heart rate.    MS:   All extremities are intact.    Neurologic:   Awake, alert, oriented x 3. No apparent focal neurological           defect.        Data Reviewed: I have personally reviewed following labs and imaging studies  CBC: Recent Labs  Lab 07/15/22 1506 07/15/22 1521 07/16/22 0520 07/18/22 0418 07/19/22 0348 07/20/22 0341  WBC 11.5*  --  7.8 12.9* 12.2* 16.2*  HGB 11.7* 11.6* 10.5* 10.4* 10.9* 10.6*  HCT 34.4* 34.0* 30.4* 30.1* 32.1* 30.6*  MCV 92.5  --  91.3 90.7 91.7 90.8  PLT 178  --  149* 165 148* 145*   Basic Metabolic Panel: Recent Labs  Lab 07/15/22 1506 07/15/22 1521 07/16/22 0520 07/17/22 0357 07/18/22 0418 07/19/22 0348 07/20/22 0341  NA 120*   < > 124* 127* 126* 125* 124*  K 3.1*   < > 4.3 4.5 3.6 3.7 3.7  CL 87*   < > 98 98 97* 96* 97*  CO2 20*  --  19* 16* 21* 22 20*  GLUCOSE 116*   < > 135* 134* 134* 127* 105*  BUN 29*   < > 21* 23* 24* 23* 23*  CREATININE 1.23   < > 0.75 0.75 0.76 0.86 0.75  CALCIUM 7.8*  --  7.7* 8.0* 7.7* 7.4* 7.2*  MG 1.7  --  2.2  --  1.7 1.6* 2.1   < > = values in this interval not displayed.   GFR: Estimated Creatinine Clearance: 78.8 mL/min (by C-G formula based on SCr of 0.75 mg/dL). Liver Function Tests: No results for input(s): "AST", "ALT", "ALKPHOS", "BILITOT", "PROT", "ALBUMIN" in the last 168 hours. No results for input(s): "LIPASE", "AMYLASE" in the last 168 hours. No results for input(s): "AMMONIA" in the last 168 hours. Coagulation Profile: No results for input(s): "INR", "PROTIME" in the last 168 hours. Cardiac Enzymes: No results for input(s): "CKTOTAL", "CKMB", "CKMBINDEX", "TROPONINI" in the last 168 hours. BNP (last 3 results) No results for input(s): "PROBNP" in the last 8760 hours. HbA1C: No results for input(s): "HGBA1C" in the last 72 hours. CBG: No results for input(s): "GLUCAP" in the last 168 hours. Lipid Profile: No results for input(s): "CHOL", "HDL", "LDLCALC", "TRIG",  "CHOLHDL", "LDLDIRECT" in the last 72 hours. Thyroid Function Tests: No results for input(s): "TSH", "T4TOTAL", "FREET4", "T3FREE", "THYROIDAB" in the last 72 hours.  Anemia Panel: No results for input(s): "VITAMINB12", "FOLATE", "FERRITIN", "TIBC", "IRON", "RETICCTPCT" in the last 72 hours. Sepsis Labs: Recent Labs  Lab 07/15/22 1518 07/15/22 1737 07/15/22 1957  LATICACIDVEN 4.2* 4.5* 2.6*    Recent Results (from the past 240 hour(s))  MRSA Next Gen by PCR, Nasal     Status: None   Collection Time: 07/15/22  8:35 PM   Specimen: Nasal Mucosa; Nasal Swab  Result Value Ref Range Status   MRSA by PCR Next Gen NOT DETECTED NOT DETECTED Final    Comment: (NOTE) The GeneXpert MRSA Assay (  FDA approved for NASAL specimens only), is one component of a comprehensive MRSA colonization surveillance program. It is not intended to diagnose MRSA infection nor to guide or monitor treatment for MRSA infections. Test performance is not FDA approved in patients less than 6 years old. Performed at [facility], [address], [place], [place] [address]          Radiology Studies: No results found.      Scheduled Meds:  (feeding supplement) PROSource Plus  30 mL Oral TID with meals   Chlorhexidine Gluconate Cloth  6 each Topical Daily   heparin  5,000 Units Subcutaneous Q8H   ipratropium-albuterol  3 mL Nebulization TID   lactose free nutrition  237 mL Oral TID BM   megestrol  400 mg Oral BID   methylPREDNISolone (SOLU-MEDROL) injection  40 mg Intravenous Q24H   metoprolol succinate  25 mg Oral Daily   multivitamin-lutein  1 capsule Oral Daily   ondansetron (ZOFRAN) IV  4 mg Intravenous Q6H   ondansetron  4 mg Oral Q8H   pantoprazole (PROTONIX) IV  40 mg Intravenous Q24H   sacubitril-valsartan  1 tablet Oral BID   Continuous Infusions:  sodium chloride 75 mL/hr at 07/20/22 0841   amiodarone 30 mg/hr (07/20/22 0841)   azithromycin 500 mg (07/19/22 2052)   promethazine  (PHENERGAN) injection (IM or IVPB) Stopped (07/17/22 1506)     LOS: 5 days    Time spent: 45 minutes spent on chart review, discussion with nursing staff, consultants, updating family and interview/physical exam; more than 50% of that time was spent in counseling and/or coordination of care.    Osmani Kersten Hoover Brunette, DO [facility] Available via Epic secure chat 7am-7pm After these hours, please refer to coverage provider listed on [email or web address] 07/20/2022, 10:28 AM

## 2022-07-20 NOTE — Evaluation (Signed)
Physical Therapy Evaluation Patient Details Name: MANTON LATHER MRN: [id number] DOB: 1960-11-16 Today's Date: 07/20/2022  History of Present Illness  CASHEL OSKEY is a 62 y.o. male with medical history significant for recent lung cancer diagnosis, tobacco abuse.   Patient was brought to the ED via EMS with reports of generalized weakness, 3 falls over the past 24 hours.  EMS found the patient to be in and out of SVT.  Patient denies palpitation, no chest pain.  Reports some dizziness when standing, and difficulty breathing with exertion.  No lower extremity swelling.  No fevers no chills.  He has chronic cough that has worsened over the past few days, with some rattling.  He has had chronic poor oral intake, with weight loss.  He was recently diagnosed with metastatic lung cancer, and has been evaluated by Dr. Ellin Saba, he has not started therapy.   Clinical Impression  Patient demonstrates slow labored movement for sitting up at bedside, had difficulty scooting to EOB, very unsteady on feet having to lean on armrest of chair for support during transfer without AD, required use of RW for safety, demonstrated slow labored movement with ataxic like movement of BLE and limited mostly due to c/o fatigue.  Patient tolerated sitting up in chair with family members present after therapy.  Patient will benefit from continued skilled physical therapy in hospital and recommended venue below to increase strength, balance, endurance for safe ADLs and gait.          Recommendations for follow up therapy are one component of a multi-disciplinary discharge planning process, led by the attending physician.  Recommendations may be updated based on patient status, additional functional criteria and insurance authorization.  Follow Up Recommendations Can patient physically be transported by private vehicle: Yes     Assistance Recommended at Discharge Set up Supervision/Assistance  Patient can return home with the  following  A lot of help with walking and/or transfers;A little help with bathing/dressing/bathroom;Help with stairs or ramp for entrance;Assistance with cooking/housework    Equipment Recommendations None recommended by PT  Recommendations for Other Services       Functional Status Assessment Patient has had a recent decline in their functional status and demonstrates the ability to make significant improvements in function in a reasonable and predictable amount of time.     Precautions / Restrictions Precautions Precautions: Fall Restrictions Weight Bearing Restrictions: No      Mobility  Bed Mobility Overal bed mobility: Needs Assistance Bed Mobility: Supine to Sit     Supine to sit: Min assist, Mod assist     General bed mobility comments: increased time, labored movement    Transfers Overall transfer level: Needs assistance Equipment used: None, Rolling walker (2 wheels) Transfers: Sit to/from Stand, Bed to chair/wheelchair/BSC Sit to Stand: Min assist   Step pivot transfers: Min assist       General transfer comment: very unsteady on feet having to lean on armrest for chair for support when not using an AD, safer using RW    Ambulation/Gait Ambulation/Gait assistance: Min assist, Mod assist Gait Distance (Feet): 22 Feet Assistive device: Rolling walker (2 wheels) Gait Pattern/deviations: Decreased step length - right, Decreased step length - left, Decreased stride length, Ataxic Gait velocity: decreased     General Gait Details: slow labored ataxic like gait requiring use of RW due to poor standing balance and limited mostly due to c/o fatigue  Stairs  Wheelchair Mobility    Modified Rankin (Stroke Patients Only)       Balance Overall balance assessment: Needs assistance Sitting-balance support: Feet supported, No upper extremity supported Sitting balance-Leahy Scale: Fair Sitting balance - Comments: seated at EOB   Standing  balance support: During functional activity, No upper extremity supported Standing balance-Leahy Scale: Poor Standing balance comment: fair/poor using RW                             Pertinent Vitals/Pain Pain Assessment Pain Assessment: No/denies pain    Home Living Family/patient expects to be discharged to:: Private residence Living Arrangements: Alone Available Help at Discharge: Family;Available 24 hours/day Type of Home: House Home Access: Stairs to enter Entrance Stairs-Rails: None Entrance Stairs-Number of Steps: 3   Home Layout: One level Home Equipment: Agricultural consultant (2 wheels);Cane - single point      Prior Function Prior Level of Function : Independent/Modified Independent;Driving             Mobility Comments: Tourist information centre manager, drives ADLs Comments: Independent     Hand Dominance        Extremity/Trunk Assessment   Upper Extremity Assessment Upper Extremity Assessment: Generalized weakness    Lower Extremity Assessment Lower Extremity Assessment: Generalized weakness    Cervical / Trunk Assessment Cervical / Trunk Assessment: Normal  Communication   Communication: No difficulties  Cognition Arousal/Alertness: Awake/alert Behavior During Therapy: WFL for tasks assessed/performed Overall Cognitive Status: Within Functional Limits for tasks assessed                                          General Comments      Exercises     Assessment/Plan    PT Assessment Patient needs continued PT services  PT Problem List Decreased strength;Decreased activity tolerance;Decreased balance;Decreased mobility       PT Treatment Interventions DME instruction;Gait training;Stair training;Therapeutic activities;Therapeutic exercise;Patient/family education;Functional mobility training;Balance training    PT Goals (Current goals can be found in the Care Plan section)  Acute Rehab PT Goals Patient Stated Goal: return home  after rehab PT Goal Formulation: With patient/family Time For Goal Achievement: 08/03/22 Potential to Achieve Goals: Good    Frequency Min 3X/week     Co-evaluation               AM-PAC PT "6 Clicks" Mobility  Outcome Measure Help needed turning from your back to your side while in a flat bed without using bedrails?: A Little Help needed moving from lying on your back to sitting on the side of a flat bed without using bedrails?: A Little Help needed moving to and from a bed to a chair (including a wheelchair)?: A Lot Help needed standing up from a chair using your arms (e.g., wheelchair or bedside chair)?: A Lot Help needed to walk in hospital room?: A Lot Help needed climbing 3-5 steps with a railing? : A Lot 6 Click Score: 14    End of Session   Activity Tolerance: Patient tolerated treatment well;Patient limited by fatigue Patient left: in chair;with call bell/phone within reach;with family/visitor present Nurse Communication: Mobility status PT Visit Diagnosis: Unsteadiness on feet (R26.81);Other abnormalities of gait and mobility (R26.89)    Time: 3086-5784 PT Time Calculation (min) (ACUTE ONLY): 24 min   Charges:   PT Evaluation $PT Eval Moderate Complexity: 1  Mod PT Treatments $Therapeutic Activity: 23-37 mins        2:04 PM, 07/20/22 Ocie Bob, MPT Physical Therapist with [facility] [phone] office [phone] mobile phone

## 2022-07-20 NOTE — Plan of Care (Signed)
  Problem: Acute Rehab PT Goals(only PT should resolve) Goal: Pt Will Go Supine/Side To Sit Outcome: Progressing Flowsheets (Taken 07/20/2022 1406) Pt will go Supine/Side to Sit:  with modified independence  with supervision Goal: Patient Will Transfer Sit To/From Stand Outcome: Progressing Flowsheets (Taken 07/20/2022 1406) Patient will transfer sit to/from stand: with supervision Goal: Pt Will Transfer Bed To Chair/Chair To Bed Outcome: Progressing Flowsheets (Taken 07/20/2022 1406) Pt will Transfer Bed to Chair/Chair to Bed: with supervision Goal: Pt Will Ambulate Outcome: Progressing Flowsheets (Taken 07/20/2022 1406) Pt will Ambulate:  75 feet  with min guard assist  with minimal assist  with rolling walker  2:07 PM, 07/20/22 Ocie Bob, MPT Physical Therapist with [facility] [phone] office [phone] mobile phone

## 2022-07-21 ENCOUNTER — Inpatient Hospital Stay: Payer: Medicaid Other

## 2022-07-21 ENCOUNTER — Ambulatory Visit (HOSPITAL_COMMUNITY)
Admit: 2022-07-21 | Discharge: 2022-07-21 | Disposition: A | Discharge status: Home / Self Care | Payer: Medicaid Other | Attending: Hematology | Admitting: Hematology

## 2022-07-21 DIAGNOSIS — R627 Adult failure to thrive: Secondary | ICD-10-CM

## 2022-07-21 DIAGNOSIS — I471 Supraventricular tachycardia, unspecified: Secondary | ICD-10-CM | POA: Diagnosis not present

## 2022-07-21 LAB — BASIC METABOLIC PANEL
Anion gap: 5 (ref 5–15)
BUN: 21 mg/dL — ABNORMAL HIGH (ref 6–20)
CO2: 20 mmol/L — ABNORMAL LOW (ref 22–32)
Calcium: 7.3 mg/dL — ABNORMAL LOW (ref 8.9–10.3)
Chloride: 99 mmol/L (ref 98–111)
Creatinine, Ser: 0.85 mg/dL (ref 0.61–1.24)
GFR, Estimated: >60 mL/min (ref >60)
Glucose, Bld: 112 mg/dL — ABNORMAL HIGH (ref 70–99)
Potassium: 4 mmol/L (ref 3.5–5.1)
Sodium: 124 mmol/L — ABNORMAL LOW (ref 135–145)

## 2022-07-21 LAB — CBC
HCT: 29 % — ABNORMAL LOW (ref 39.0–52.0)
Hemoglobin: 10 g/dL — ABNORMAL LOW (ref 13.0–17.0)
MCH: 31.2 pg (ref 26.0–34.0)
MCHC: 34.5 g/dL (ref 30.0–36.0)
MCV: 90.3 fL (ref 80.0–100.0)
Platelets: 129 10*3/uL — ABNORMAL LOW (ref 150–400)
RBC: 3.21 MIL/uL — ABNORMAL LOW (ref 4.22–5.81)
RDW: 15.9 % — ABNORMAL HIGH (ref 11.5–15.5)
WBC: 14.8 10*3/uL — ABNORMAL HIGH (ref 4.0–10.5)
nRBC: 0 % (ref 0.0–0.2)

## 2022-07-21 LAB — MAGNESIUM: Magnesium: 1.8 mg/dL (ref 1.7–2.4)

## 2022-07-21 MED ORDER — AMIODARONE HCL 200 MG PO TABS
200.0000 mg | ORAL_TABLET | Freq: Two times a day (BID) | ORAL | Status: DC
  Administered 2022-07-21 – 2022-07-23 (×5): 200 mg via ORAL
  Filled 2022-07-21 (×6): qty 1

## 2022-07-21 NOTE — Progress Notes (Signed)
Physical Therapy Treatment Patient Details Name: [name] Stone MRN: [id number] DOB: January 08, 1961 Today's Date: 07/21/2022   History of Present Illness [name] Stone is a 62 y.o. male with medical history significant for recent lung cancer diagnosis, tobacco abuse.   Patient was brought to the ED via EMS with reports of generalized weakness, 3 falls over the past 24 hours.  EMS found the patient to be in and out of SVT.  Patient denies palpitation, no chest pain.  Reports some dizziness when standing, and difficulty breathing with exertion.  No lower extremity swelling.  No fevers no chills.  He has chronic cough that has worsened over the past few days, with some rattling.  He has had chronic poor oral intake, with weight loss.  He was recently diagnosed with metastatic lung cancer, and has been evaluated by Dr. Ellin Saba, he has not started therapy.    PT Comments    Pt friendly and willing to participate with therapy, mother present in room during session.  Pt A & O x 3.  Pt presents with very slow labored movements and shallow breathing.  Therapist monitored O2 saturation % through session and required cueing when dropped to 87%, able to increase to 91% prior transfer and gait.  Used RW for stability upon standing and gait.  Pt presents with very slow labored ataxic movements and decreased tolerance for Lt LE stance phase.  No reports of pain through session, when asked pt stated "I feel like one leg is longer than the other".  EOS pt left in chair with call bell within reach and mother present in room.      Recommendations for follow up therapy are one component of a multi-disciplinary discharge planning process, led by the attending physician.  Recommendations may be updated based on patient status, additional functional criteria and insurance authorization.  Follow Up Recommendations       Assistance Recommended at Discharge    Patient can return home with the following     Equipment  Recommendations       Recommendations for Other Services       Precautions / Restrictions Precautions Precautions: Fall Restrictions Weight Bearing Restrictions: No     Mobility  Bed Mobility Overal bed mobility: Modified Independent Bed Mobility: Supine to Sit     Supine to sit: Min guard     General bed mobility comments: increased time, labored movement    Transfers Overall transfer level: Modified independent Equipment used: Rollator (4 wheels) Transfers: Sit to/from Stand Sit to Stand: Min assist           General transfer comment: Cueing for safe mechanics with STS with cueing to scoot to EOB and hand placement to assist with sit to stand, used RW for stability upon standing.    Ambulation/Gait Ambulation/Gait assistance: Min assist, Mod assist Gait Distance (Feet): 25 Feet Assistive device: Rolling walker (2 wheels) Gait Pattern/deviations: Decreased step length - right, Decreased step length - left, Decreased stride length, Ataxic Gait velocity: decreased     General Gait Details: slow labored ataxic like gait requiring use of RW due to poor standing balance and limited mostly due to c/o fatigue   Stairs             Wheelchair Mobility    Modified Rankin (Stroke Patients Only)       Balance  Cognition Arousal/Alertness: Awake/alert Behavior During Therapy: WFL for tasks assessed/performed Overall Cognitive Status: Within Functional Limits for tasks assessed                                          Exercises      General Comments        Pertinent Vitals/Pain Pain Assessment Pain Assessment: No/denies pain    Home Living                          Prior Function            PT Goals (current goals can now be found in the care plan section)      Frequency    Min 3X/week      PT Plan      Co-evaluation               AM-PAC PT "6 Clicks" Mobility   Outcome Measure  Help needed turning from your back to your side while in a flat bed without using bedrails?: A Little Help needed moving from lying on your back to sitting on the side of a flat bed without using bedrails?: A Little Help needed moving to and from a bed to a chair (including a wheelchair)?: A Lot Help needed standing up from a chair using your arms (e.g., wheelchair or bedside chair)?: A Lot Help needed to walk in hospital room?: A Lot Help needed climbing 3-5 steps with a railing? : A Lot 6 Click Score: 14    End of Session Equipment Utilized During Treatment: Gait belt Activity Tolerance: Patient tolerated treatment well;Patient limited by fatigue Patient left: in chair;with call bell/phone within reach;with family/visitor present Nurse Communication: Mobility status PT Visit Diagnosis: Unsteadiness on feet (R26.81);Other abnormalities of gait and mobility (R26.89)     Time: 1450-1515 PT Time Calculation (min) (ACUTE ONLY): 25 min  Charges:  $Therapeutic Activity: 23-37 mins                     Becky Sax, LPTA/CLT; CBIS [phone]  Juel Burrow 07/21/2022, 4:27 PM

## 2022-07-21 NOTE — Progress Notes (Signed)
Palliative: Mr. Abadi is lying quietly in bed in a dark room.  He appears acutely/chronically ill and quite frail.  He is alert and oriented x 3, able to make his needs known.  His daughter, Haynes Dage, is present at bedside.  We talk about transitioning from IV heart medicines to by mouth.  We talk about his visit with trusted oncologist yesterday and the plan for chemotherapy if he is able.  I greatly encouraged patient and family to work closely with trusted oncologist.  They state understanding and agreement.  Daughter [place] asks about DNR.  I share that this will be completed and placed on chart.  Conference with bedside nursing staff, transition of care team related to patient condition, needs, goals of care, disposition.  Plan: Continue to treat the treatable but no CPR or intubation.  Time for outcomes.  Agreeable to short-term rehab, accepted bed offer at [facility].  Follow-up with trusted oncologist outpatient for chemotherapy if possible. DNR/goldenrod form completed and placed on chart.  50 minutes   Lillia Carmel, NP Palliative medicine team Team phone [phone] Greater than 50% of this time was spent counseling and coordinating care related to the above assessment and plan.

## 2022-07-21 NOTE — Progress Notes (Signed)
FMLA papers completed and copy given to family member.

## 2022-07-21 NOTE — Progress Notes (Signed)
Rounding Note    Patient Name: [name] Stone Date of Encounter: 07/21/2022  [facility] Cardiologist: Dina Rich, MD   Subjective   No complaints  Inpatient Medications    Scheduled Meds:  (feeding supplement) PROSource Plus  30 mL Oral TID with meals   Chlorhexidine Gluconate Cloth  6 each Topical Daily   heparin  5,000 Units Subcutaneous Q8H   ipratropium-albuterol  3 mL Nebulization TID   lactose free nutrition  237 mL Oral TID BM   megestrol  400 mg Oral BID   metoprolol succinate  25 mg Oral Daily   multivitamin-lutein  1 capsule Oral Daily   ondansetron  4 mg Oral Q8H   pantoprazole (PROTONIX) IV  40 mg Intravenous Q24H   predniSONE  40 mg Oral Q breakfast   sacubitril-valsartan  1 tablet Oral BID   Continuous Infusions:  sodium chloride 75 mL/hr at 07/20/22 2323   amiodarone 30 mg/hr (07/20/22 2002)   promethazine (PHENERGAN) injection (IM or IVPB) Stopped (07/17/22 1506)   PRN Meds: acetaminophen **OR** acetaminophen, alum & mag hydroxide-simeth, hydrALAZINE, ipratropium-albuterol, labetalol, mouth rinse, polyethylene glycol, prochlorperazine, promethazine (PHENERGAN) injection (IM or IVPB)   Vital Signs    Vitals:   07/21/22 0408 07/21/22 0600 07/21/22 0813 07/21/22 0814  BP: (!) 98/58 91/64  98/65  Pulse: 78 77  82  Resp: 16 19  (!) 22  Temp: 98.4 F (36.9 C)  98.1 F (36.7 C)   TempSrc: Oral     SpO2: 91% (!) 89%  95%  Weight:  58.5 kg    Height:        Intake/Output Summary (Last 24 hours) at 07/21/2022 0824 Last data filed at 07/21/2022 0412 Gross per 24 hour  Intake 2494.09 ml  Output 600 ml  Net 1894.09 ml      07/21/2022    6:00 AM 07/20/2022    5:34 AM 07/19/2022    5:34 AM  Last 3 Weights  Weight (lbs) 128 lb 15.5 oz 125 lb 120 lb 5.9 oz  Weight (kg) 58.5 kg 56.7 kg 54.6 kg      Telemetry    NSR - Personally Reviewed  ECG    N/a - Personally Reviewed  Physical Exam   GEN: No acute distress.   Neck: No  JVD Cardiac: RRR, no murmurs, rubs, or gallops.  Respiratory: mild bilateral wheezing GI: Soft, nontender, non-distended  MS: No edema; No deformity. Neuro:  Nonfocal  Psych: Normal affect   Labs    High Sensitivity Troponin:   Recent Labs  Lab 07/15/22 1506 07/15/22 1737  TROPONINIHS 54* 54*     Chemistry Recent Labs  Lab 07/19/22 0348 07/20/22 0341 07/20/22 1041 07/21/22 0511  NA 125* 124* 123* 124*  K 3.7 3.7 4.1 4.0  CL 96* 97* 98 99  CO2 22 20* 19* 20*  GLUCOSE 127* 105* 133* 112*  BUN 23* 23* 22* 21*  CREATININE 0.86 0.75 0.82 0.85  CALCIUM 7.4* 7.2* 7.1* 7.3*  MG 1.6* 2.1  --  1.8  GFRNONAA >60 >60 >60 >60  ANIONGAP 7 7 6 5     Lipids No results for input(s): "CHOL", "TRIG", "HDL", "LABVLDL", "LDLCALC", "CHOLHDL" in the last 168 hours.  Hematology Recent Labs  Lab 07/19/22 0348 07/20/22 0341 07/21/22 0511  WBC 12.2* 16.2* 14.8*  RBC 3.50* 3.37* 3.21*  HGB 10.9* 10.6* 10.0*  HCT 32.1* 30.6* 29.0*  MCV 91.7 90.8 90.3  MCH 31.1 31.5 31.2  MCHC 34.0 34.6 34.5  RDW 15.7* 15.8* 15.9*  PLT 148* 145* 129*   Thyroid  Recent Labs  Lab 07/15/22 1737  TSH 1.387    BNPNo results for input(s): "BNP", "PROBNP" in the last 168 hours.  DDimer  Recent Labs  Lab 07/15/22 1541  DDIMER 5.27*     Radiology    No results found.  Cardiac Studies     Patient Profile  [name] Stone is a 62 y.o. male with a hx of metastatic lung cancer who is being seen 07/15/2022 for the evaluation of tachycardia at the request of Dr Angelena Sole.   Assessment & Plan    1.SVT - narrow complex regular tach 220 on presentation. Did not break with adenososine.   At slower rates evidence of  a long RP tachycardia consistent with atach - K 3.1 (though normal by istat), Mg 1.7 on admission - soft bp's initially,  started on IV amiodarone by ER staff - SVT in setting of low K, Mg, metastatic cancer, possible bronchitis elevated lactic acid.    -issues with N/V, transitioned from  oral amio back to IV - to me reports N/V has resolved, transition back to oral amio. Dose would be 200mg  bid for additional 2 weeks then 200mg  daily. If recurrent N/V without clear cause question would be if could be amio, may have to consider coming off.    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 Was on aldactone earlier admit now off, assume due to soft bp/s.  Admitted hypovolemic with poor oral intake, hold on SGLT2i for now - metastatiac CA and just mild LV dysfunction, no plans for ishcemic testing at this time. Repeat echo 3-6 months       3. Metastatic lung CA - per primary team   4. Hyponatremia - per primary team - improving with IVFs  We will sign off inpatient care and arrange outpatient f/u.   For questions or updates, please contact [facility] Please consult [email or web address] for contact info under        Signed, Dina Rich, MD  07/21/2022, 8:24 AM

## 2022-07-21 NOTE — Progress Notes (Signed)
PROGRESS NOTE    [name] Stone  ZOX:[id number] DOB: 10/30/1960 DOA: 07/15/2022 PCP: Patient, No Pcp Per    Brief Narrative:   [name] Stone is a 62 y.o. male with medical history significant for recent lung cancer diagnosis, tobacco abuse.  Patient was brought to the ED via EMS with reports of generalized weakness, 3 falls over the past 24 hours.  EMS found the patient to be in and out of SVT.  Patient denies palpitation, no chest pain.  Reports some dizziness when standing, and difficulty breathing with exertion.  No lower extremity swelling.  No fevers no chills.  He has chronic cough that has worsened over the past few days, with some rattling. He has had chronic poor oral intake, with weight loss. He was recently diagnosed with metastatic lung cancer, and has been evaluated by Dr. Ellin Saba, he has not started therapy, but looks forward to this in the near future.  He has been transition from IV amiodarone to oral amiodarone today and if heart rates continue to remain stable on telemetry, he may discharge to SNF/rehab in the next 24-48 hours.  Assessment and Plan: * SVT (supraventricular tachycardia) Heart rates up to 180s.  Temporary break with 6 mg and then 12 mg of adenosine, and was back in SVT.  150 mg amiodarone bolus given, currently on amiodarone drip.  Heart rate controlled in the 100s.  No cardiac history.  No chest pain.  No palpitation.  Reports some dizziness, dyspnea on exertion.  Hypokalemia 3.1.  Magnesium 1.7.  Was hypotensive to 70s, with resultant lactic acidosis. -Cardiology has transition from back from IV amiodarone to oral amiodarone today due to adequate oral intake -Echocardiogram with LVEF 40-45%  Acute bronchitis-resolved Dyspnea on exertion, diffuse rhonchi, worsening cough.  Denies diagnosis of COPD.  Ongoing tobacco abuse 1 pack/day.  CTA chest negative for acute abnormality.  O2 sats -DuoNebs as needed and scheduled -Mucolytics as needed -Complete oral  prednisone taper as prescribed  AKI (acute kidney injury) (HCC)-resolved Continue to monitor a.m. labs  Chronic hyponatremia Likely in the setting of poor oral intake with occasional vomiting, continue to monitor  Metastatic cancer [facility]) Following with Dr. Ellin Saba.  New diagnosis of non-small cell lung cancer.  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  CT chest 05/28/22 showed numerous bilateral lung nodules.  Right brain no metastatic disease, PET scan 3/25 - Innumerable bilateral hypermetabolic lung nodules, lymphadenopathy.   -Seen by oncology Dr. Ellin Saba 5/6.  Continue to monitor for any overall improvement and if possible, we will try for palliative chemotherapy otherwise will require hospice evaluation -Anticipate placement of port outpatient after discharge to initiate  Intractable nausea and vomiting-resolved Continue soft diet and switch Zofran back to as needed  DVT prophylaxis: heparin injection 5,000 Units Start: 07/15/22 2200    Code Status: DNR Family Communication: Daughter at bedside 5/7  Disposition Plan:  Level of care: Telemetry Status is: Inpatient Remains inpatient appropriate because: needs IV meds    Consultants:  Cards Oncology   Subjective: Patient seen and evaluated this a.m. he is now tolerating diet and denies any further nausea or vomiting.  Heart rate remained stable.  Objective: Vitals:   07/21/22 1000 07/21/22 1030 07/21/22 1036 07/21/22 1041  BP: (!) 113/56 124/67  124/67  Pulse: 78 87  80  Resp: 20  19 18   Temp:      TempSrc:      SpO2: 95% 95%  98%  Weight:  Height:        Intake/Output Summary (Last 24 hours) at 07/21/2022 1052 Last data filed at 07/21/2022 0412 Gross per 24 hour  Intake 2021.89 ml  Output 600 ml  Net 1421.89 ml   Filed Weights   07/19/22 0534 07/20/22 0534 07/21/22 0600  Weight: 54.6 kg 56.7 kg 58.5 kg    Examination:   General: Appearance:    Thin male in no acute distress, somnolent     Lungs:      Rhonchus on right, few expiratory wheezes, respirations unlabored- not on O2  Heart:    Normal heart rate.    MS:   All extremities are intact.    Neurologic:   Awake, alert, oriented x 3. No apparent focal neurological           defect.        Data Reviewed: I have personally reviewed following labs and imaging studies  CBC: Recent Labs  Lab 07/16/22 0520 07/18/22 0418 07/19/22 0348 07/20/22 0341 07/21/22 0511  WBC 7.8 12.9* 12.2* 16.2* 14.8*  HGB 10.5* 10.4* 10.9* 10.6* 10.0*  HCT 30.4* 30.1* 32.1* 30.6* 29.0*  MCV 91.3 90.7 91.7 90.8 90.3  PLT 149* 165 148* 145* 129*   Basic Metabolic Panel: Recent Labs  Lab 07/16/22 0520 07/17/22 0357 07/18/22 0418 07/19/22 0348 07/20/22 0341 07/20/22 1041 07/21/22 0511  NA 124*   < > 126* 125* 124* 123* 124*  K 4.3   < > 3.6 3.7 3.7 4.1 4.0  CL 98   < > 97* 96* 97* 98 99  CO2 19*   < > 21* 22 20* 19* 20*  GLUCOSE 135*   < > 134* 127* 105* 133* 112*  BUN 21*   < > 24* 23* 23* 22* 21*  CREATININE 0.75   < > 0.76 0.86 0.75 0.82 0.85  CALCIUM 7.7*   < > 7.7* 7.4* 7.2* 7.1* 7.3*  MG 2.2  --  1.7 1.6* 2.1  --  1.8   < > = values in this interval not displayed.   GFR: Estimated Creatinine Clearance: 76.5 mL/min (by C-G formula based on SCr of 0.85 mg/dL). Liver Function Tests: No results for input(s): "AST", "ALT", "ALKPHOS", "BILITOT", "PROT", "ALBUMIN" in the last 168 hours. No results for input(s): "LIPASE", "AMYLASE" in the last 168 hours. No results for input(s): "AMMONIA" in the last 168 hours. Coagulation Profile: No results for input(s): "INR", "PROTIME" in the last 168 hours. Cardiac Enzymes: No results for input(s): "CKTOTAL", "CKMB", "CKMBINDEX", "TROPONINI" in the last 168 hours. BNP (last 3 results) No results for input(s): "PROBNP" in the last 8760 hours. HbA1C: No results for input(s): "HGBA1C" in the last 72 hours. CBG: No results for input(s): "GLUCAP" in the last 168 hours. Lipid Profile: No results for  input(s): "CHOL", "HDL", "LDLCALC", "TRIG", "CHOLHDL", "LDLDIRECT" in the last 72 hours. Thyroid Function Tests: No results for input(s): "TSH", "T4TOTAL", "FREET4", "T3FREE", "THYROIDAB" in the last 72 hours.  Anemia Panel: No results for input(s): "VITAMINB12", "FOLATE", "FERRITIN", "TIBC", "IRON", "RETICCTPCT" in the last 72 hours. Sepsis Labs: Recent Labs  Lab 07/15/22 1518 07/15/22 1737 07/15/22 1957  LATICACIDVEN 4.2* 4.5* 2.6*    Recent Results (from the past 240 hour(s))  MRSA Next Gen by PCR, Nasal     Status: None   Collection Time: 07/15/22  8:35 PM   Specimen: Nasal Mucosa; Nasal Swab  Result Value Ref Range Status   MRSA by PCR Next Gen NOT DETECTED NOT DETECTED Final  Comment: (NOTE) The GeneXpert MRSA Assay (FDA approved for NASAL specimens only), is one component of a comprehensive MRSA colonization surveillance program. It is not intended to diagnose MRSA infection nor to guide or monitor treatment for MRSA infections. Test performance is not FDA approved in patients less than 12 years old. Performed at [facility], [address], [place], [place] [address]          Radiology Studies: No results found.      Scheduled Meds:  (feeding supplement) PROSource Plus  30 mL Oral TID with meals   amiodarone  200 mg Oral BID   Chlorhexidine Gluconate Cloth  6 each Topical Daily   heparin  5,000 Units Subcutaneous Q8H   ipratropium-albuterol  3 mL Nebulization TID   lactose free nutrition  237 mL Oral TID BM   megestrol  400 mg Oral BID   metoprolol succinate  25 mg Oral Daily   multivitamin-lutein  1 capsule Oral Daily   ondansetron  4 mg Oral Q8H   pantoprazole (PROTONIX) IV  40 mg Intravenous Q24H   predniSONE  40 mg Oral Q breakfast   sacubitril-valsartan  1 tablet Oral BID   Continuous Infusions:  promethazine (PHENERGAN) injection (IM or IVPB) Stopped (07/17/22 1506)     LOS: 6 days    Time spent: 45 minutes spent on chart review,  discussion with nursing staff, consultants, updating family and interview/physical exam; more than 50% of that time was spent in counseling and/or coordination of care.    [name] Pittsley Hoover Brunette, DO [facility] Available via Epic secure chat 7am-7pm After these hours, please refer to coverage provider listed on [email or web address] 07/21/2022, 10:52 AM

## 2022-07-21 NOTE — TOC Progression Note (Addendum)
Transition of Care [facility]) - Progression Note    Patient Details  Name: [name] Stone MRN: [id number] Date of Birth: [birth year]  Transition of Care [facility]) CM/SW Contact  Villa Herb, [place] Phone Number: 07/21/2022, 11:58 AM  Clinical Narrative:    CSW reviewed bed offer from [facility] in room with pt and daughter. They accept this offer. CSW reached out to [facility] in admissions to request that auth be started. TOC to follow.   CSW updated that pt and family would like to speak with CSW. CSW met at bedside. Pts daughter states that they now wish for pt to return home with [facility]. CSW explained that we will attempt to get [facility] however, due to pts insurance it may difficult but it will be referred out to all agencies.   Expected Discharge Plan: Skilled Nursing Facility Barriers to Discharge: Continued Medical Work up  Expected Discharge Plan and Services In-house Referral: Clinical Social Work   Post Acute Care Choice: Skilled Nursing Facility Living arrangements for the past 2 months: Single Family Home                                       Social Determinants of Health (SDOH) Interventions SDOH Screenings   Food Insecurity: No Food Insecurity (07/15/2022)  Housing: Low Risk  (07/15/2022)  Transportation Needs: No Transportation Needs (07/15/2022)  Utilities: Not At Risk (07/15/2022)  Depression (PHQ2-9): Low Risk  (06/08/2022)  Tobacco Use: High Risk (07/20/2022)    Readmission Risk Interventions     No data to display

## 2022-07-22 ENCOUNTER — Encounter: Payer: Self-pay | Admitting: Hematology

## 2022-07-22 ENCOUNTER — Other Ambulatory Visit (HOSPITAL_COMMUNITY): Payer: Self-pay

## 2022-07-22 ENCOUNTER — Telehealth (HOSPITAL_COMMUNITY): Payer: Self-pay | Admitting: Pharmacy Technician

## 2022-07-22 LAB — BASIC METABOLIC PANEL
Anion gap: 4 — ABNORMAL LOW (ref 5–15)
BUN: 18 mg/dL (ref 6–20)
CO2: 22 mmol/L (ref 22–32)
Calcium: 7.4 mg/dL — ABNORMAL LOW (ref 8.9–10.3)
Chloride: 97 mmol/L — ABNORMAL LOW (ref 98–111)
Creatinine, Ser: 0.76 mg/dL (ref 0.61–1.24)
GFR, Estimated: >60 mL/min (ref >60)
Glucose, Bld: 116 mg/dL — ABNORMAL HIGH (ref 70–99)
Potassium: 4.3 mmol/L (ref 3.5–5.1)
Sodium: 123 mmol/L — ABNORMAL LOW (ref 135–145)

## 2022-07-22 LAB — CBC
HCT: 30.3 % — ABNORMAL LOW (ref 39.0–52.0)
Hemoglobin: 10.6 g/dL — ABNORMAL LOW (ref 13.0–17.0)
MCH: 31.9 pg (ref 26.0–34.0)
MCHC: 35 g/dL (ref 30.0–36.0)
MCV: 91.3 fL (ref 80.0–100.0)
Platelets: 126 10*3/uL — ABNORMAL LOW (ref 150–400)
RBC: 3.32 MIL/uL — ABNORMAL LOW (ref 4.22–5.81)
RDW: 15.8 % — ABNORMAL HIGH (ref 11.5–15.5)
WBC: 14.3 10*3/uL — ABNORMAL HIGH (ref 4.0–10.5)
nRBC: 0 % (ref 0.0–0.2)

## 2022-07-22 LAB — MAGNESIUM: Magnesium: 1.8 mg/dL (ref 1.7–2.4)

## 2022-07-22 MED ORDER — IPRATROPIUM-ALBUTEROL 0.5-2.5 (3) MG/3ML IN SOLN
3.0000 mL | Freq: Three times a day (TID) | RESPIRATORY_TRACT | Status: DC
  Administered 2022-07-22 – 2022-07-23 (×3): 3 mL via RESPIRATORY_TRACT
  Filled 2022-07-22 (×3): qty 3

## 2022-07-22 MED ORDER — METHYLPREDNISOLONE SODIUM SUCC 40 MG IJ SOLR
40.0000 mg | Freq: Two times a day (BID) | INTRAMUSCULAR | Status: DC
  Administered 2022-07-22 – 2022-07-23 (×2): 40 mg via INTRAVENOUS
  Filled 2022-07-22 (×2): qty 1

## 2022-07-22 MED ORDER — PANTOPRAZOLE SODIUM 40 MG PO TBEC
40.0000 mg | DELAYED_RELEASE_TABLET | Freq: Every day | ORAL | Status: DC
  Administered 2022-07-23: 40 mg via ORAL
  Filled 2022-07-22: qty 1

## 2022-07-22 NOTE — TOC Progression Note (Addendum)
Transition of Care [facility]) - Progression Note    Patient Details  Name: [name] Stone MRN: [id number] Date of Birth: April 14, 1960  Transition of Care [facility]) CM/SW Contact  Villa Herb, [place] Phone Number: 07/22/2022, 1:04 PM  Clinical Narrative:    CSW spoke with pts daughter to update that at this time CSW has been unsuccessful in finding a HH agency to accept pts insurance. She is understandable and agreeable to outpatient PT referral being made. CSW made this referral to [facility]. CSW updated that pt will need O2 and a nebulizer at D/C. CSW spoke with pts daughter who states they do not have a preference in agency. CSW spoke to Dewar with [facility] who accepts referral. Tank will be delivered to room prior to pt discharging home. CSW updated that pt and family request referral for outpatient palliative be sent to [facility]. CSW sent referral to [facility] with [facility] who will follow.  TOC to follow.   Expected Discharge Plan: Skilled Nursing Facility Barriers to Discharge: Continued Medical Work up  Expected Discharge Plan and Services In-house Referral: Clinical Social Work   Post Acute Care Choice: Skilled Nursing Facility Living arrangements for the past 2 months: Single Family Home                                       Social Determinants of Health (SDOH) Interventions SDOH Screenings   Food Insecurity: No Food Insecurity (07/15/2022)  Housing: Low Risk  (07/15/2022)  Transportation Needs: No Transportation Needs (07/15/2022)  Utilities: Not At Risk (07/15/2022)  Depression (PHQ2-9): Low Risk  (06/08/2022)  Tobacco Use: High Risk (07/20/2022)    Readmission Risk Interventions     No data to display

## 2022-07-22 NOTE — Telephone Encounter (Signed)
Pharmacy Patient Advocate Encounter  Insurance verification completed.    The patient is insured through [employer] [place] [employer]   The patient is currently admitted and ran test claims for the following: Entresto, Farxiga, Jardiance.  Copays and coinsurance results were relayed to Inpatient clinical team.

## 2022-07-22 NOTE — Progress Notes (Signed)
[facility]) Hospital Liaison Note  Notified by TOC manager of patient/family request for [facility] palliative services at home after discharge.   [facility] hospital liaison will follow patient for discharge disposition.   Please call with any hospice or outpatient palliative care related questions.   Thank you for the opportunity to participate in this patient's care.   Shanita Wicker, LCSW [facility] Hospital Liaison [phone]  

## 2022-07-22 NOTE — TOC Benefit Eligibility Note (Signed)
Patient Advocate Encounter  Insurance verification completed.    The patient is currently admitted and upon discharge could be taking Entresto 24-26 mg.  The current 30 day co-pay is $4.00.   The patient is currently admitted and upon discharge could be taking Farxiga 10 mg.  Requires Prior Authorization  The patient is currently admitted and upon discharge could be taking Jardiance 10 mg.  Requires Prior Authorization  The patient is insured through [employer] [place] [employer]   This test claim was processed through [facility]- copay amounts may vary at other pharmacies due to pharmacy/plan contracts, or as the patient moves through the different stages of their insurance plan.  Yeshaya Vath, CPHT Pharmacy Patient Advocate Specialist [facility] Patient Advocate Team Direct Number: [phone]  Fax: [phone]       

## 2022-07-22 NOTE — Progress Notes (Signed)
SATURATION QUALIFICATIONS: (This note is used to comply with regulatory documentation for home oxygen)  Patient Saturations on Room Air at Rest = 83%  Patient Saturations on Room Air while Ambulating = N/A  Patient Saturations on 3 Liters of oxygen while Ambulating = 92%

## 2022-07-22 NOTE — Progress Notes (Signed)
[facility] PROGRESS NOTE  [name] Stone (DOB: 02/23/1961) UJW:[id number] PCP: Patient, No Pcp Per  Brief Narrative:  [name] Stone is a 62 y.o. male with medical history significant for recent lung cancer diagnosis, tobacco abuse.  Patient was brought to the ED via EMS with reports of generalized weakness, 3 falls over the past 24 hours.  EMS found the patient to be in and out of SVT.  Patient denies palpitation, no chest pain.  Reports some dizziness when standing, and difficulty breathing with exertion.  No lower extremity swelling.  No fevers no chills.  He has chronic cough that has worsened over the past few days, with some rattling. He has had chronic poor oral intake, with weight loss. He was recently diagnosed with metastatic lung cancer, and has been evaluated by Dr. Ellin Stone, he has not started therapy, but looks forward to this in the near future.  He has been transition from IV amiodarone to oral amiodarone per cardiology. Remains hypoxic with wheezing, so augmenting nebulized therapies and steroids. Palliative care involved in care as well.   Subjective: Declines bed offer at [facility], opts to go home. In the process of arranging everything. Still short of breath worse than his baseline. Still wheezing off and on, improved with nebs. No chest pain or other complaints.   Objective: BP 132/66   Pulse 86   Temp 97.8 F (36.6 C) (Oral)   Resp 20   Ht 5\' 6"  (1.676 m)   Wt 58.5 kg   SpO2 93%   BMI 20.82 kg/m   Gen: Frail male appearing older than stated age, pleasant Pulm: Expiratory coarse wheezes throughout, diminished.  Nonlabored with supplemental oxygen CV: RRR, no MRG or edema GI: Soft, NT, ND, +BS  Neuro: Alert and oriented. No new focal deficits. Ext: Warm, no deformities. Skin: No rashes, lesions or ulcers on visualized skin   Assessment & Plan: Principal Problem:   SVT (supraventricular tachycardia) Active Problems:   Hyponatremia   AKI (acute  kidney injury) (HCC)   Acute bronchitis   Metastatic cancer (HCC)   Hypokalemia   Adenocarcinoma of right lung (HCC)   Lactic acidosis   Protein-calorie malnutrition, severe   Primary malignant neoplasm of lung metastatic to other site [facility])  Acute hypoxic respiratory failure: Acute bronchitis and he's still wheezing/tight on baseline metastatic lung CA.  - Increase frequency of scheduled nebs, continue prn's.  - Ordered home neb and O2 in preparation for discharge once not in active bronchospasm.  - Augment steroids to IV BID - Discussed home palliative services as another layer of support at home, they wish to pursue this. Palliative NP at bedside, aware, appreciate discussion.  - Anticipate DC with home oxygen, will quantify need prior to DC  SVT: Not responsive to adenosine.  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  - Started metoprolol, entresto (co-pay is $4). Holding spiro with soft BP. With poor oral intake/hypovolemia, holding SGLT2i and diuretic.    AKI (acute kidney injury) (HCC)-resolved Continue to monitor a.m. labs   Chronic hyponatremia Likely in the setting of poor oral intake with occasional vomiting, continue to monitor   Metastatic cancer [facility]) Following with Dr. Ellin Stone.  New diagnosis of non-small cell lung cancer.  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  CT chest 05/28/22 showed numerous bilateral lung nodules.  Right brain no metastatic disease, PET scan 3/25 - Innumerable bilateral hypermetabolic lung nodules, lymphadenopathy.   -  Seen by oncology Dr. Ellin Stone 5/6.  Continue to monitor for any overall improvement and if possible, we will try for palliative chemotherapy otherwise will require hospice evaluation -Anticipate placement of port outpatient after discharge to initiate   Tyrone Nine, MD [facility] [email or web address] 07/22/2022, 12:04 PM

## 2022-07-22 NOTE — Progress Notes (Signed)
Palliative: [name] Stone is resting quietly in bed.  He appears acutely/chronically ill and somewhat frail.  He is resting comfortably, but wakes easily when I enter.  He is alert and oriented x 3, able to make his needs known.  His son, [name] Stone, is present at bedside.  As we are talking, attending Dr. Jarvis Newcomer, arrives.  We talk about the plan for home with home health if service provider can be found.  [name] Stone has had FMLA paperwork completed that we will allow him to be primary caregiver for [name] Stone.  We talk about following up with trusted oncologist, [name] Stone, for treatment plans.  I share that functional status would be the main indicator of his ability to take therapy.  We talked about outpatient palliative services.  Patient and family are agreeable to this service.  Provider choice offered.  They elect [facility] as [name] Stone lives in [place].  Conference with attending, bedside nursing staff, transition of care team related to patient condition, needs, goals of care, disposition.  Plan: Continue to treat the treatable but no CPR or intubation.  Home with home health if provider can be found.  Outpatient palliative services with [facility].  Follow-up with [name] Stone for treatment plan options. DNR/goldenrod form is on chart.  50 minutes Lillia Carmel, NP Palliative medicine team Team phone [phone] Greater than 50% of this time was spent counseling and coordinating care related to the above assessment and plan.

## 2022-07-23 ENCOUNTER — Emergency Department (HOSPITAL_COMMUNITY)
Admission: EM | Admit: 2022-07-23 | Discharge: 2022-07-24 | Disposition: A | Discharge status: Home / Self Care | Payer: Medicaid Other | Attending: Emergency Medicine | Admitting: Emergency Medicine

## 2022-07-23 ENCOUNTER — Encounter (HOSPITAL_COMMUNITY): Payer: Self-pay | Admitting: Emergency Medicine

## 2022-07-23 ENCOUNTER — Other Ambulatory Visit: Payer: Self-pay

## 2022-07-23 ENCOUNTER — Ambulatory Visit: Payer: Medicaid Other

## 2022-07-23 DIAGNOSIS — R609 Edema, unspecified: Secondary | ICD-10-CM

## 2022-07-23 DIAGNOSIS — C3490 Malignant neoplasm of unspecified part of unspecified bronchus or lung: Secondary | ICD-10-CM | POA: Diagnosis not present

## 2022-07-23 DIAGNOSIS — Z79899 Other long term (current) drug therapy: Secondary | ICD-10-CM | POA: Diagnosis not present

## 2022-07-23 DIAGNOSIS — N5089 Other specified disorders of the male genital organs: Secondary | ICD-10-CM | POA: Diagnosis present

## 2022-07-23 HISTORY — DX: Malignant neoplasm of bone and articular cartilage, unspecified: C41.9

## 2022-07-23 HISTORY — DX: Malignant neoplasm of unspecified part of unspecified bronchus or lung: C34.90

## 2022-07-23 MED ORDER — METOPROLOL SUCCINATE ER 25 MG PO TB24: 25.0000 mg | ORAL_TABLET | Freq: Every day | ORAL | 0 refills | Status: AC

## 2022-07-23 MED ORDER — SACUBITRIL-VALSARTAN 49-51 MG PO TABS: 1.0000 | ORAL_TABLET | Freq: Two times a day (BID) | ORAL | 0 refills | Status: AC

## 2022-07-23 MED ORDER — IPRATROPIUM-ALBUTEROL 0.5-2.5 (3) MG/3ML IN SOLN: 3.0000 mL | RESPIRATORY_TRACT | 0 refills | Status: AC | PRN

## 2022-07-23 MED ORDER — AMIODARONE HCL 200 MG PO TABS: ORAL_TABLET | ORAL | 0 refills | Status: AC

## 2022-07-23 MED ORDER — PREDNISONE 20 MG PO TABS: ORAL_TABLET | ORAL | 0 refills | Status: AC

## 2022-07-23 NOTE — Progress Notes (Signed)
Nsg Discharge Note  Admit Date:  07/15/2022 Discharge date: 07/23/2022   [name] Stone to be D/C'd Home per MD order.  AVS completed.  Patient/caregiver able to verbalize understanding.  Discharge Medication: Allergies as of 07/23/2022   Not on File      Medication List     STOP taking these medications    ibuprofen 200 MG tablet Commonly known as: ADVIL       TAKE these medications    amiodarone 200 MG tablet Commonly known as: PACERONE Take 1 tablet (200 mg total) by mouth 2 (two) times daily for 14 days, THEN 1 tablet (200 mg total) daily for 16 days. and thereafter. Start taking on: Jul 23, 2022   folic acid 1 MG tablet Commonly known as: FOLVITE Take 1 tablet (1 mg total) by mouth daily.   ipratropium-albuterol 0.5-2.5 (3) MG/3ML Soln Commonly known as: DUONEB Take 3 mLs by nebulization every 4 (four) hours as needed.   megestrol 400 MG/10ML suspension Commonly known as: MEGACE Take 10 mLs (400 mg total) by mouth 2 (two) times daily.   metoprolol succinate 25 MG 24 hr tablet Commonly known as: TOPROL-XL Take 1 tablet (25 mg total) by mouth daily. Start taking on: Jul 24, 2022   predniSONE 20 MG tablet Commonly known as: DELTASONE Take 3 tablets (60 mg total) by mouth daily with breakfast for 2 days, THEN 2 tablets (40 mg total) daily with breakfast for 2 days, THEN 1 tablet (20 mg total) daily with breakfast for 2 days. Start taking on: Jul 23, 2022   sacubitril-valsartan 49-51 MG Commonly known as: ENTRESTO Take 1 tablet by mouth 2 (two) times daily.   tamsulosin 0.4 MG Caps capsule Commonly known as: Flomax Take 1 capsule (0.4 mg total) by mouth daily after supper.               Durable Medical Equipment  (From admission, onward)           Start     Ordered   07/23/22 1141  For home use only DME oxygen  Once       Question Answer Comment  Length of Need 6 Months   Mode or (Route) Nasal cannula   Liters per Minute 2   Frequency  Continuous (stationary and portable oxygen unit needed)   Oxygen delivery system Gas      07/23/22 1140   07/23/22 1139  For home use only DME Nebulizer/meds  Once       Question Answer Comment  Patient needs a nebulizer to treat with the following condition COPD (chronic obstructive pulmonary disease) (HCC)   Patient needs a nebulizer to treat with the following condition Lung cancer (HCC)   Length of Need Lifetime      07/23/22 1140            Discharge Assessment: Vitals:   07/23/22 0423 07/23/22 0809  BP: (!) 148/85   Pulse: 90 71  Resp: 18 20  Temp: 98.1 F (36.7 C)   SpO2: 95% 95%   Skin clean, dry and intact without evidence of skin break down, no evidence of skin tears noted. IV catheter discontinued intact. Site without signs and symptoms of complications - no redness or edema noted at insertion site, patient denies c/o pain - only slight tenderness at site.  Dressing with slight pressure applied.  D/c Instructions-Education: Discharge instructions given to patient/family with verbalized understanding. D/c education completed with patient/family including follow up instructions, medication list, d/c  activities limitations if indicated, with other d/c instructions as indicated by MD - patient able to verbalize understanding, all questions fully answered. Patient instructed to return to ED, call 911, or call MD for any changes in condition.  Patient escorted via WC, and D/C home via private auto.  Laurena Spies, RN 07/23/2022 11:56 AM

## 2022-07-23 NOTE — Progress Notes (Signed)
Physical Therapy Treatment Patient Details Name: [name] Stone MRN: [id number] DOB: Feb 18, 1961 Today's Date: 07/23/2022   History of Present Illness [name] Stone is a 62 y.o. male with medical history significant for recent lung cancer diagnosis, tobacco abuse.   Patient was brought to the ED via EMS with reports of generalized weakness, 3 falls over the past 24 hours.  EMS found the patient to be in and out of SVT.  Patient denies palpitation, no chest pain.  Reports some dizziness when standing, and difficulty breathing with exertion.  No lower extremity swelling.  No fevers no chills.  He has chronic cough that has worsened over the past few days, with some rattling.  He has had chronic poor oral intake, with weight loss.  He was recently diagnosed with metastatic lung cancer, and has been evaluated by Dr. Ellin Saba, he has not started therapy.    PT Comments    Patient agreeable for therapy, his daughter present in room.  Patient had labored movement for sitting up at bedside, required verbal/tactile cueing for proper hand placement during sit to stands with fair/good carryover and tolerated walking to doorway and back with slow labored unsteady cadence and limited bilateral ankle dorsiflexion.  Patient requested to go back to bed after therapy due to c/o fatigue.  Patient will benefit from continued skilled physical therapy in hospital and recommended venue below to increase strength, balance, endurance for safe ADLs and gait.     Recommendations for follow up therapy are one component of a multi-disciplinary discharge planning process, led by the attending physician.  Recommendations may be updated based on patient status, additional functional criteria and insurance authorization.  Follow Up Recommendations  Can patient physically be transported by private vehicle: Yes    Assistance Recommended at Discharge Set up Supervision/Assistance  Patient can return home with the following A lot of  help with walking and/or transfers;A little help with bathing/dressing/bathroom;Help with stairs or ramp for entrance;Assistance with cooking/housework   Equipment Recommendations  None recommended by PT    Recommendations for Other Services       Precautions / Restrictions Precautions Precautions: Fall Restrictions Weight Bearing Restrictions: No     Mobility  Bed Mobility Overal bed mobility: Needs Assistance Bed Mobility: Supine to Sit     Supine to sit: Min guard, Min assist     General bed mobility comments: increased time, labored movement    Transfers Overall transfer level: Needs assistance Equipment used: Rolling walker (2 wheels) Transfers: Sit to/from Stand Sit to Stand: Min assist   Step pivot transfers: Min assist       General transfer comment: slow labored unsteady movement, required verbal/tactile cueing for proper hand placement during sit to stands    Ambulation/Gait Ambulation/Gait assistance: Min assist, Mod assist Gait Distance (Feet): 22 Feet Assistive device: Rolling walker (2 wheels) Gait Pattern/deviations: Decreased step length - right, Decreased step length - left, Decreased stride length, Ataxic, Decreased dorsiflexion - left, Decreased dorsiflexion - right Gait velocity: decreased     General Gait Details: slow labored unsteady cadence with diffiuclty dorsiflexing BLE ankles resulting in hip hiking gait, frequent standing rest breaks and limited mostly due to fatigue, on 3 LPM with SpO2 at 89-91%   Stairs             Wheelchair Mobility    Modified Rankin (Stroke Patients Only)       Balance Overall balance assessment: Needs assistance Sitting-balance support: Feet supported, No upper extremity supported Sitting  balance-Leahy Scale: Fair Sitting balance - Comments: fair/good seated at EOB   Standing balance support: Reliant on assistive device for balance, During functional activity, Bilateral upper extremity  supported Standing balance-Leahy Scale: Poor Standing balance comment: fair/poor using RW                            Cognition Arousal/Alertness: Awake/alert Behavior During Therapy: WFL for tasks assessed/performed Overall Cognitive Status: Within Functional Limits for tasks assessed                                          Exercises General Exercises - Lower Extremity Long Arc Quad: Seated, AROM, Strengthening, Both, 10 reps Hip Flexion/Marching: Seated, AROM, Strengthening, Both, 10 reps Toe Raises: Seated, AROM, Strengthening, Both, 10 reps Heel Raises: Seated, AROM, Strengthening, Both, 10 reps    General Comments        Pertinent Vitals/Pain Pain Assessment Pain Assessment: No/denies pain    Home Living                          Prior Function            PT Goals (current goals can now be found in the care plan section) Acute Rehab PT Goals Patient Stated Goal: return home after rehab PT Goal Formulation: With patient/family Time For Goal Achievement: 08/03/22 Potential to Achieve Goals: Good Progress towards PT goals: Progressing toward goals    Frequency    Min 3X/week      PT Plan Discharge plan needs to be updated    Co-evaluation              AM-PAC PT "6 Clicks" Mobility   Outcome Measure  Help needed turning from your back to your side while in a flat bed without using bedrails?: A Little Help needed moving from lying on your back to sitting on the side of a flat bed without using bedrails?: A Little Help needed moving to and from a bed to a chair (including a wheelchair)?: A Little Help needed standing up from a chair using your arms (e.g., wheelchair or bedside chair)?: A Little Help needed to walk in hospital room?: A Lot Help needed climbing 3-5 steps with a railing? : A Lot 6 Click Score: 16    End of Session Equipment Utilized During Treatment: Oxygen Activity Tolerance: Patient tolerated  treatment well;Patient limited by fatigue Patient left: in bed;with call bell/phone within reach;with family/visitor present Nurse Communication: Mobility status PT Visit Diagnosis: Unsteadiness on feet (R26.81);Other abnormalities of gait and mobility (R26.89);Muscle weakness (generalized) (M62.81)     Time: 1115-1140 PT Time Calculation (min) (ACUTE ONLY): 25 min  Charges:  $Gait Training: 8-22 mins $Therapeutic Exercise: 8-22 mins                     12:23 PM, 07/23/22 Ocie Bob, MPT Physical Therapist with [facility] [phone] office [phone] mobile phone

## 2022-07-23 NOTE — TOC Transition Note (Signed)
Transition of Care [facility]) - CM/SW Discharge Note   Patient Details  Name: [name] Stone MRN: [id number] Date of Birth: 06-Jun-1960  Transition of [facility] CM/SW Contact:  Villa Herb, LCSWA Phone Number: 07/23/2022, 12:25 PM   Clinical Narrative:    Outpatient PT referral made. CSW updated Morrie Sheldon with [facility] of plan for D/C today. O2 tank delivered to pts room at this time. TOC signing off.   Final next level of care: OP Rehab Barriers to Discharge: Barriers Resolved   Patient Goals and CMS Choice CMS Medicare.gov Compare Post Acute Care list provided to:: Patient Choice offered to / list presented to : Patient  Discharge Placement                         Discharge Plan and Services Additional resources added to the After Visit Summary for   In-house Referral: Clinical Social Work   Post Acute Care Choice: Skilled Nursing Facility          DME Arranged: Oxygen DME Agency: [facility] Date DME Agency Contacted: 07/23/22   Representative spoke with at DME Agency: Morrie Sheldon            Social Determinants of Health (SDOH) Interventions SDOH Screenings   Food Insecurity: No Food Insecurity (07/15/2022)  Housing: Low Risk  (07/15/2022)  Transportation Needs: No Transportation Needs (07/15/2022)  Utilities: Not At Risk (07/15/2022)  Depression (PHQ2-9): Low Risk  (06/08/2022)  Tobacco Use: High Risk (07/20/2022)     Readmission Risk Interventions     No data to display

## 2022-07-23 NOTE — ED Triage Notes (Addendum)
Pt BIB [facility] with c/o groin swelling starting today, pt was discharged today, hx of bone cancer and stage 4 lung cancer, per EMS vss en route

## 2022-07-23 NOTE — Progress Notes (Signed)
Pt discharged to POV via WC by nursing staff. Personal belongings in daughter's possession, O2 tank with pt for transport.

## 2022-07-23 NOTE — Discharge Summary (Signed)
Physician Discharge Summary   Patient: [name] Stone MRN: [id number] DOB: 09-17-1960  Admit date:     07/15/2022  Discharge date: 07/23/22  Discharge Physician: Tyrone Nine   PCP: Patient, No Pcp Per   Recommendations at discharge:  Follow up with cardiology 5/23. Follow up with oncology, Dr. Ellin Saba, 5/28.  Follow up with cardiology in 2 weeks, discharging on oral amiodarone due to SVT.  Continue home palliative care.  Discharge Diagnoses: Principal Problem:   SVT (supraventricular tachycardia) Active Problems:   Hyponatremia   AKI (acute kidney injury) (HCC)   Acute bronchitis   Metastatic cancer (HCC)   Hypokalemia   Adenocarcinoma of right lung (HCC)   Lactic acidosis   Protein-calorie malnutrition, severe   Primary malignant neoplasm of lung metastatic to other site [facility])  Hospital Course: [name] Stone is a 62 y.o. male with medical history significant for recent lung cancer diagnosis, tobacco abuse.  Patient was brought to the ED via EMS with reports of generalized weakness, 3 falls over the past 24 hours.  EMS found the patient to be in and out of SVT.  Patient denies palpitation, no chest pain.  Reports some dizziness when standing, and difficulty breathing with exertion.  No lower extremity swelling.  No fevers no chills.  He has chronic cough that has worsened over the past few days, with some rattling. He has had chronic poor oral intake, with weight loss. He was recently diagnosed with metastatic lung cancer, and has been evaluated by Dr. Ellin Saba, he has not started therapy, but looks forward to this in the near future.  He has been transition from IV amiodarone to oral amiodarone per cardiology. Remains hypoxic but respiratory status has stabilized. He will be discharged with steroids, nebulized treatment and supplemental oxygen as well as home palliative care support. Please see details below.   Assessment and Plan: Acute hypoxic respiratory failure: Acute  bronchitis and he's still wheezing/tight on baseline metastatic lung CA.  - Increase frequency of scheduled nebs, continue prn's.  - Ordered home neb and O2 as his emphysema/COPD (though no previous formal diagnosis) is likely to necessitate ongoing Tx. - Complete a longer steroid taper at discharge - Discussed home palliative services as another layer of support at home, they wish to pursue this.    SVT: Not responsive to adenosine.  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  - Started metoprolol, entresto (co-pay is $4). Holding spiro with soft BP. With poor oral intake/hypovolemia, holding SGLT2i and diuretic.    Severe protein calorie malnutrition:  - Supplement protein - Nutritionist follow up arranged. - Has been on megace, which can be continued   AKI: Resolved.   Chronic hyponatremia Likely in the setting of poor oral intake with occasional vomiting, continue to monitor   Metastatic cancer: New diagnosis of non-small cell lung cancer.  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  CT chest 05/28/22 showed numerous bilateral lung nodules.  Right brain no metastatic disease, PET scan 3/25 - Innumerable bilateral hypermetabolic lung nodules, lymphadenopathy.   -Seen by oncology Dr. Ellin Saba 5/6, has follow up arranged after discharge. Continue to monitor for any overall improvement and if possible, we will try for palliative chemotherapy otherwise will require hospice evaluation. Home palliative care to follow in community setting. - Anticipate placement of port outpatient after discharge   Consultants: Cardiology, oncology Procedures performed: Echo  Disposition: Home Diet recommendation:  Regular diet DISCHARGE  MEDICATION: Allergies as of 07/23/2022   Not on File      Medication List     STOP taking these medications    ibuprofen 200 MG tablet Commonly known as: ADVIL       TAKE these medications    amiodarone  200 MG tablet Commonly known as: PACERONE Take 1 tablet (200 mg total) by mouth 2 (two) times daily for 14 days, THEN 1 tablet (200 mg total) daily for 16 days. and thereafter. Start taking on: Jul 23, 2022   folic acid 1 MG tablet Commonly known as: FOLVITE Take 1 tablet (1 mg total) by mouth daily.   ipratropium-albuterol 0.5-2.5 (3) MG/3ML Soln Commonly known as: DUONEB Take 3 mLs by nebulization every 4 (four) hours as needed.   megestrol 400 MG/10ML suspension Commonly known as: MEGACE Take 10 mLs (400 mg total) by mouth 2 (two) times daily.   metoprolol succinate 25 MG 24 hr tablet Commonly known as: TOPROL-XL Take 1 tablet (25 mg total) by mouth daily. Start taking on: Jul 24, 2022   predniSONE 20 MG tablet Commonly known as: DELTASONE Take 3 tablets (60 mg total) by mouth daily with breakfast for 2 days, THEN 2 tablets (40 mg total) daily with breakfast for 2 days, THEN 1 tablet (20 mg total) daily with breakfast for 2 days. Start taking on: Jul 23, 2022   sacubitril-valsartan 49-51 MG Commonly known as: ENTRESTO Take 1 tablet by mouth 2 (two) times daily.   tamsulosin 0.4 MG Caps capsule Commonly known as: Flomax Take 1 capsule (0.4 mg total) by mouth daily after supper.               Durable Medical Equipment  (From admission, onward)           Start     Ordered   07/23/22 1141  For home use only DME oxygen  Once       Question Answer Comment  Length of Need 6 Months   Mode or (Route) Nasal cannula   Liters per Minute 2   Frequency Continuous (stationary and portable oxygen unit needed)   Oxygen delivery system Gas      07/23/22 1140   07/23/22 1139  For home use only DME Nebulizer/meds  Once       Question Answer Comment  Patient needs a nebulizer to treat with the following condition COPD (chronic obstructive pulmonary disease) (HCC)   Patient needs a nebulizer to treat with the following condition Lung cancer (HCC)   Length of Need Lifetime       07/23/22 1140            Follow-up Information     [facility] Follow up.   Specialty: Cardiology Why: Cardiology Hospital Follow-up on 08/06/2022 at 2:30 PM with Frutoso Schatz, NP Contact information: [address] [id number] [place] [address] [phone]        Doreatha Massed, MD Follow up.   Specialty: Hematology Contact information: [address] [place] [address] [phone]                Discharge Exam: Filed Weights   07/19/22 0534 07/20/22 0534 07/21/22 0600  Weight: 54.6 kg 56.7 kg 58.5 kg  BP (!) 148/85   Pulse 71   Temp 98.1 F (36.7 C)   Resp 20   Ht 5\' 6"  (1.676 m)   Wt 58.5 kg   SpO2 95%   BMI 20.82 kg/m  Chronically ill-appearing but in no distress, states he feels well, ready to go home.  Diminished without crackles or wheezes, nonlabored completely supine RRR, no MRG or edema  Condition at discharge: stable  The results of significant diagnostics from this hospitalization (including imaging, microbiology, ancillary and laboratory) are listed below for reference.   Imaging Studies: ECHOCARDIOGRAM COMPLETE  Result Date: 07/16/2022    ECHOCARDIOGRAM REPORT   Patient Name:   DEMEATRICE KADIR Date of Exam: 07/16/2022 Medical Rec #:  [id number]     Height:       66.0 in Accession #:    [id number]    Weight:       108.9 lb Date of Birth:  Sep 20, 1960     BSA:          1.545 m Patient Age:    61 years      BP:           153/90 mmHg Patient Gender: M             HR:           99 bpm. [facility] Procedure: 2D Echo, Cardiac Doppler and Color Doppler Indications:    SVT (supraventricular tachycardia) [[id number]]  History:        Patient has no prior history of Echocardiogram examinations.                 Cancer, Arrythmias:Tachycardia; Risk Factors:Current Smoker.  Sonographer:    Aron Baba Referring Phys: [id number] Onnie Boer  Sonographer Comments: Image acquisition challenging  due to patient body habitus. IMPRESSIONS  1. Left ventricular ejection fraction, by estimation, is 40 to 45%. The left ventricle has mildly decreased function. The left ventricle demonstrates global hypokinesis. Left ventricular diastolic parameters are indeterminate.  2. Right ventricular systolic function is normal. The right ventricular size is normal. There is moderately elevated pulmonary artery systolic pressure.  3. The mitral valve was not well visualized. No evidence of mitral valve regurgitation. No evidence of mitral stenosis.  4. The tricuspid valve is abnormal. Tricuspid valve regurgitation is moderate.  5. The aortic valve was not well visualized. Aortic valve regurgitation is not visualized. No aortic stenosis is present.  6. Pulmonic valve regurgitation not able to assess. not able to assess pulmonic stenosis.  7. The inferior vena cava is dilated in size with >50% respiratory variability, suggesting right atrial pressure of 8 mmHg.  8. Technically difficult study FINDINGS  Left Ventricle: Left ventricular ejection fraction, by estimation, is 40 to 45%. The left ventricle has mildly decreased function. The left ventricle demonstrates global hypokinesis. The left ventricular internal cavity size was normal in size. There is  no left ventricular hypertrophy. Left ventricular diastolic parameters are indeterminate. Right Ventricle: The right ventricular size is normal. Right vetricular wall thickness was not well visualized. Right ventricular systolic function is normal. There is moderately elevated pulmonary artery systolic pressure. The tricuspid regurgitant velocity is 3.18 m/s, and with an assumed right atrial pressure of 8 mmHg, the estimated right ventricular systolic pressure is 48.4 mmHg. Left Atrium: Left atrial size was not well visualized. Right Atrium: Right atrial size was not well visualized. Pericardium: There is no evidence of pericardial effusion. Mitral Valve: The mitral valve was not  well visualized. No evidence of mitral valve regurgitation. No evidence of mitral valve stenosis. Tricuspid Valve: The tricuspid valve is abnormal. Tricuspid valve regurgitation is moderate . No evidence of tricuspid stenosis. Aortic Valve: The aortic valve was  not well visualized. Aortic valve regurgitation is not visualized. No aortic stenosis is present. Aortic valve mean gradient measures 2.4 mmHg. Aortic valve peak gradient measures 4.6 mmHg. Aortic valve area, by VTI measures 1.74 cm. Pulmonic Valve: The pulmonic valve was not well visualized. Pulmonic valve regurgitation not able to assess. Not able to assess pulmonic stenosis. Aorta: The aortic root is normal in size and structure. Venous: The inferior vena cava is dilated in size with greater than 50% respiratory variability, suggesting right atrial pressure of 8 mmHg. IAS/Shunts: No atrial level shunt detected by color flow Doppler.  LEFT VENTRICLE PLAX 2D LVIDd:         4.10 cm     Diastology LVIDs:         3.30 cm     LV e' medial:    6.31 cm/s LV PW:         1.00 cm     LV E/e' medial:  12.3 LV IVS:        0.70 cm     LV e' lateral:   11.90 cm/s LVOT diam:     1.90 cm     LV E/e' lateral: 6.5 LV SV:         32 LV SV Index:   21 LVOT Area:     2.84 cm  LV Volumes (MOD) LV vol d, MOD A2C: 72.2 ml LV vol d, MOD A4C: 81.9 ml LV vol s, MOD A2C: 42.1 ml LV vol s, MOD A4C: 42.3 ml LV SV MOD A2C:     30.1 ml LV SV MOD A4C:     81.9 ml LV SV MOD BP:      33.8 ml RIGHT VENTRICLE RV S prime:     15.10 cm/s TAPSE (M-mode): 2.7 cm LEFT ATRIUM           Index        RIGHT ATRIUM           Index LA diam:      4.40 cm 2.85 cm/m   RA Area:     11.40 cm LA Vol (A4C): 99.7 ml 64.55 ml/m  RA Volume:   23.70 ml  15.34 ml/m  AORTIC VALVE AV Area (Vmax):    1.83 cm AV Area (Vmean):   1.82 cm AV Area (VTI):     1.74 cm AV Vmax:           107.08 cm/s AV Vmean:          72.611 cm/s AV VTI:            0.186 m AV Peak Grad:      4.6 mmHg AV Mean Grad:      2.4 mmHg LVOT  Vmax:         69.00 cm/s LVOT Vmean:        46.700 cm/s LVOT VTI:          0.114 m LVOT/AV VTI ratio: 0.61  AORTA Ao Root diam: 3.80 cm MITRAL VALVE               TRICUSPID VALVE MV Area (PHT): 4.17 cm    TR Peak grad:   40.4 mmHg MV Decel Time: 182 msec    TR Vmax:        318.00 cm/s MV E velocity: 77.60 cm/s MV A velocity: 51.40 cm/s  SHUNTS MV E/A ratio:  1.51        Systemic VTI:  0.11 m  Systemic Diam: 1.90 cm Dina Rich MD Electronically signed by Dina Rich MD Signature Date/Time: 07/16/2022/12:08:24 PM    Final    CT Angio Chest PE W and/or Wo Contrast  Result Date: 07/15/2022 CLINICAL DATA:  Abdominal trauma. Concern for pulmonary embolism. Lung cancer. EXAM: CT ANGIOGRAPHY CHEST CT ABDOMEN AND PELVIS WITH CONTRAST TECHNIQUE: Multidetector CT imaging of the chest was performed using the standard protocol during bolus administration of intravenous contrast. Multiplanar CT image reconstructions and MIPs were obtained to evaluate the vascular anatomy. Multidetector CT imaging of the abdomen and pelvis was performed using the standard protocol during bolus administration of intravenous contrast. RADIATION DOSE REDUCTION: This exam was performed according to the departmental dose-optimization program which includes automated exposure control, adjustment of the mA and/or kV according to patient size and/or use of iterative reconstruction technique. CONTRAST:  OMNIPAQUE IOHEXOL 350 MG/ML SOLN COMPARISON:  Chest radiograph dated 07/15/2022. PET CT dated 06/04/2022.Chest CT dated 05/28/2022. FINDINGS: Evaluation of this exam is limited due to respiratory motion artifact. CTA CHEST FINDINGS Cardiovascular: There is no cardiomegaly or pericardial effusion. Mild atherosclerotic calcification of the thoracic aorta. No aneurysmal dilatation or dissection. No pulmonary artery embolus identified. Mediastinum/Nodes: Right hilar and subcarinal adenopathy relatively similar to the  CT of 05/28/2022. The esophagus is grossly unremarkable. No mediastinal fluid collection. Lungs/Pleura: Overall slight interval increase in the size of bilateral pulmonary metastatic lesions seen space CT of 05/28/2022. For example a 1.5 cm nodule in the left lower lobe (104/8) previously measured approximately 1.0 cm. There is a small right pleural effusion. No pneumothorax. The central airways are patent. Musculoskeletal: Osteopenia with degenerative changes of the spine. Similar pattern osseous metastatic disease as CT of 05/28/2022. No acute osseous pathology. Loss of subcutaneous fat and cachexia. Review of the MIP images confirms the above findings. CT ABDOMEN and PELVIS FINDINGS No intra-abdominal free air or free fluid. Hepatobiliary: The liver is unremarkable. No biliary dilatation. The gallbladder is unremarkable. Pancreas: No acute findings. Spleen: Normal in size without focal abnormality. Adrenals/Urinary Tract: The adrenal glands are unremarkable. Kidneys, visualized ureters, and urinary bladder appear unremarkable. Stomach/Bowel: There is no bowel obstruction or active inflammation. The appendix is normal. Vascular/Lymphatic: Mild aortoiliac atherosclerotic disease. The IVC is unremarkable. No portal venous gas. No adenopathy. Reproductive: The prostate and seminal vesicles are grossly unremarkable. No pelvic mass. Other: None Musculoskeletal: Osteopenia with degenerative changes of the spine. Similar appearance of L2 sclerotic metastatic disease. No acute osseous pathology. Review of the MIP images confirms the above findings. IMPRESSION: 1. No acute intrathoracic, abdominal, or pelvic pathology. No pulmonary artery embolus identified. 2. Overall slight interval increase in the size of bilateral pulmonary metastatic lesions compared to the CT of 05/28/2022. 3. Small right pleural effusion. 4. Similar pattern of osseous metastatic disease as CT of 05/28/2022. 5. No definite evidence of metastasis in  the abdomen and pelvis. 6.  Aortic Atherosclerosis (ICD10-I70.0). Electronically Signed   By: Elgie Collard M.D.   On: 07/15/2022 17:19   CT ABDOMEN PELVIS W CONTRAST  Result Date: 07/15/2022 CLINICAL DATA:  Abdominal trauma. Concern for pulmonary embolism. Lung cancer. EXAM: CT ANGIOGRAPHY CHEST CT ABDOMEN AND PELVIS WITH CONTRAST TECHNIQUE: Multidetector CT imaging of the chest was performed using the standard protocol during bolus administration of intravenous contrast. Multiplanar CT image reconstructions and MIPs were obtained to evaluate the vascular anatomy. Multidetector CT imaging of the abdomen and pelvis was performed using the standard protocol during bolus administration of intravenous contrast. RADIATION DOSE REDUCTION: This  exam was performed according to the departmental dose-optimization program which includes automated exposure control, adjustment of the mA and/or kV according to patient size and/or use of iterative reconstruction technique. CONTRAST:  OMNIPAQUE IOHEXOL 350 MG/ML SOLN COMPARISON:  Chest radiograph dated 07/15/2022. PET CT dated 06/04/2022.Chest CT dated 05/28/2022. FINDINGS: Evaluation of this exam is limited due to respiratory motion artifact. CTA CHEST FINDINGS Cardiovascular: There is no cardiomegaly or pericardial effusion. Mild atherosclerotic calcification of the thoracic aorta. No aneurysmal dilatation or dissection. No pulmonary artery embolus identified. Mediastinum/Nodes: Right hilar and subcarinal adenopathy relatively similar to the CT of 05/28/2022. The esophagus is grossly unremarkable. No mediastinal fluid collection. Lungs/Pleura: Overall slight interval increase in the size of bilateral pulmonary metastatic lesions seen space CT of 05/28/2022. For example a 1.5 cm nodule in the left lower lobe (104/8) previously measured approximately 1.0 cm. There is a small right pleural effusion. No pneumothorax. The central airways are patent. Musculoskeletal:  Osteopenia with degenerative changes of the spine. Similar pattern osseous metastatic disease as CT of 05/28/2022. No acute osseous pathology. Loss of subcutaneous fat and cachexia. Review of the MIP images confirms the above findings. CT ABDOMEN and PELVIS FINDINGS No intra-abdominal free air or free fluid. Hepatobiliary: The liver is unremarkable. No biliary dilatation. The gallbladder is unremarkable. Pancreas: No acute findings. Spleen: Normal in size without focal abnormality. Adrenals/Urinary Tract: The adrenal glands are unremarkable. Kidneys, visualized ureters, and urinary bladder appear unremarkable. Stomach/Bowel: There is no bowel obstruction or active inflammation. The appendix is normal. Vascular/Lymphatic: Mild aortoiliac atherosclerotic disease. The IVC is unremarkable. No portal venous gas. No adenopathy. Reproductive: The prostate and seminal vesicles are grossly unremarkable. No pelvic mass. Other: None Musculoskeletal: Osteopenia with degenerative changes of the spine. Similar appearance of L2 sclerotic metastatic disease. No acute osseous pathology. Review of the MIP images confirms the above findings. IMPRESSION: 1. No acute intrathoracic, abdominal, or pelvic pathology. No pulmonary artery embolus identified. 2. Overall slight interval increase in the size of bilateral pulmonary metastatic lesions compared to the CT of 05/28/2022. 3. Small right pleural effusion. 4. Similar pattern of osseous metastatic disease as CT of 05/28/2022. 5. No definite evidence of metastasis in the abdomen and pelvis. 6.  Aortic Atherosclerosis (ICD10-I70.0). Electronically Signed   By: Elgie Collard M.D.   On: 07/15/2022 17:19   CT Head Wo Contrast  Result Date: 07/15/2022 CLINICAL DATA:  Weakness and tachycardia. 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 EXAM: CT HEAD WITHOUT CONTRAST TECHNIQUE: Contiguous axial images were obtained from the base of the skull through the vertex without intravenous  contrast. RADIATION DOSE REDUCTION: This exam was performed according to the departmental dose-optimization program which includes automated exposure control, adjustment of the mA and/or kV according to patient size and/or use of iterative reconstruction technique. COMPARISON:  MRI head 06/02/2022 and CT head 05/07/2001 FINDINGS: Brain: No intracranial hemorrhage, mass effect, or evidence of acute infarct. No hydrocephalus. No extra-axial fluid collection. Generalized cerebral atrophy. Ill-defined hypoattenuation within the cerebral white matter is nonspecific but consistent with chronic small vessel ischemic disease. Chronic right thalamic infarcts. Vascular: No hyperdense vessel. Intracranial arterial calcification. Skull: No fracture or focal lesion. Sinuses/Orbits: No acute finding. Paranasal sinuses and mastoid air cells are well aerated. Other: None. IMPRESSION: 1. No evidence of acute intracranial abnormality. Electronically Signed   By: Minerva Fester M.D.   On: 07/15/2022 17:05   DG Chest Port 1 View  Result Date: 07/15/2022 CLINICAL DATA:  Weakness since Monday, SVT, cancer patient,  smoker EXAM: PORTABLE CHEST 1 VIEW COMPARISON:  Portable exam 1527 hours compared to 06/25/2022 FINDINGS: External pacing leads. Normal heart size, mediastinal contours, and pulmonary vascularity. Emphysematous changes with multiple small nodular foci in both lungs consistent with pulmonary metastases. Confluence mass identified mid to lateral RIGHT lung. RIGHT apical pleuroparenchymal opacity unchanged. No new infiltrate or pneumothorax. Trace RIGHT pleural effusion. Bones demineralized. IMPRESSION: Pulmonary metastases greatest lateral mid RIGHT lung. Underlying COPD changes. Emphysema (ICD10-J43.9). Electronically Signed   By: Ulyses Southward M.D.   On: 07/15/2022 15:38   CT LUNG MASS BIOPSY  Result Date: 06/25/2022 INDICATION: Multifocal hypermetabolic RIGHT lung masses. EXAM: CT-GUIDED RIGHT LUNG MASS BIOPSY  COMPARISON:  PET-CT, 06/04/2022.  CT chest, 05/28/2022. MEDICATIONS: Metoprolol 10 mg IV.  Hydralazine 10 mg IV.  Metoprolol 100 mg p.o. Medications administered for patient's hypertension. ANESTHESIA/SEDATION: Moderate (conscious) sedation was employed during this procedure. A total of Versed 0.5 mg and Fentanyl 25 mcg was administered intravenously. Moderate Sedation Time: 20 minutes. The patient's level of consciousness and vital signs were monitored continuously by radiology nursing throughout the procedure under my direct supervision. CONTRAST:  None COMPLICATIONS: None immediate. PROCEDURE: RADIATION DOSE REDUCTION: This exam was performed according to the departmental dose-optimization program which includes automated exposure control, adjustment of the mA and/or kV according to patient size and/or use of iterative reconstruction technique. Informed consent was obtained from the patient following an explanation of the procedure, risks, benefits and alternatives. The patient understands,agrees and consents for the procedure. All questions were addressed. A time out was performed prior to the initiation of the procedure. The patient was positioned supine on the CT table and a limited chest CT was performed for procedural planning demonstrating multifocal RIGHT lung masses. The operative site was prepped and draped in the usual sterile fashion. Under sterile conditions and local anesthesia, a 17 gauge coaxial needle was advanced into the peripheral aspect of the nodule. Positioning was confirmed with intermittent CT fluoroscopy and followed by the acquisition of 3 core biopsies with an 18 gauge core needle biopsy device. The coaxial needle was removed following deployment of a hemostatic patch and superficial hemostasis was achieved with manual compression. Limited post procedural chest CT was negative for pneumothorax or additional complication. A dressing was placed. The patient tolerated the procedure well  without immediate postprocedural complication. The patient was escorted to have an upright chest radiograph. IMPRESSION: Successful CT guided core needle core biopsy of RIGHT lung mass. Roanna Banning, MD [facility] Electronically Signed   By: Roanna Banning M.D.   On: 06/25/2022 16:24   DG Chest Port 1 View  Result Date: 06/25/2022 CLINICAL DATA:  1 hour follow-up post right lung biopsy. EXAM: PORTABLE CHEST 1 VIEW COMPARISON:  Chest x-ray 06/25/2022, 2:10 p.m. FINDINGS: No definite pneumothorax visualized. Focal dense opacity in the lateral right lung is unchanged. Bilateral pulmonary nodules are unchanged. There is stable small right pleural effusion and right apical pleural thickening. Cardiomediastinal silhouette is unchanged. Osseous structures are stable. IMPRESSION: 1. No definite pneumothorax visualized. 2. Stable dense opacity in the lateral right lung. 3. Stable small right pleural effusion and pulmonary nodules. Electronically Signed   By: Darliss Cheney M.D.   On: 06/25/2022 15:43   DG Chest Port 1 View  Result Date: 06/25/2022 CLINICAL DATA:  Status post lung biopsy. EXAM: PORTABLE CHEST 1 VIEW COMPARISON:  Earlier today FINDINGS: There is a tiny pneumothorax identified overlying the periphery of the right upper lobe on the  order of 4 mm. Again seen is the peripheral right mid lung mass and numerous bilateral pulmonary nodules. Stable cardiomediastinal contours. IMPRESSION: 1. Tiny right upper lobe pneumothorax post biopsy. 2. Similar appearance of right mid lung mass and numerous bilateral pulmonary nodules. Electronically Signed   By: Signa Kell M.D.   On: 06/25/2022 14:35    Microbiology: Results for orders placed or performed during the hospital encounter of 07/15/22  MRSA Next Gen by PCR, Nasal     Status: None   Collection Time: 07/15/22  8:35 PM   Specimen: Nasal Mucosa; Nasal Swab  Result Value Ref Range Status   MRSA by  PCR Next Gen NOT DETECTED NOT DETECTED Final    Comment: (NOTE) The GeneXpert MRSA Assay (FDA approved for NASAL specimens only), is one component of a comprehensive MRSA colonization surveillance program. It is not intended to diagnose MRSA infection nor to guide or monitor treatment for MRSA infections. Test performance is not FDA approved in patients less than 31 years old. Performed at [facility], [address], [place], [place] [address]     Labs: CBC: Recent Labs  Lab 07/18/22 [id number] 07/19/22 0348 07/20/22 0341 07/21/22 0511 07/22/22 0400  WBC 12.9* 12.2* 16.2* 14.8* 14.3*  HGB 10.4* 10.9* 10.6* 10.0* 10.6*  HCT 30.1* 32.1* 30.6* 29.0* 30.3*  MCV 90.7 91.7 90.8 90.3 91.3  PLT 165 148* 145* 129* 126*   Basic Metabolic Panel: Recent Labs  Lab 07/18/22 0418 07/19/22 0348 07/20/22 0341 07/20/22 1041 07/21/22 0511 07/22/22 0400  NA 126* 125* 124* 123* 124* 123*  K 3.6 3.7 3.7 4.1 4.0 4.3  CL 97* 96* 97* 98 99 97*  CO2 21* 22 20* 19* 20* 22  GLUCOSE 134* 127* 105* 133* 112* 116*  BUN 24* 23* 23* 22* 21* 18  CREATININE 0.76 0.86 0.75 0.82 0.85 0.76  CALCIUM 7.7* 7.4* 7.2* 7.1* 7.3* 7.4*  MG 1.7 1.6* 2.1  --  1.8 1.8   Liver Function Tests: No results for input(s): "AST", "ALT", "ALKPHOS", "BILITOT", "PROT", "ALBUMIN" in the last 168 hours. CBG: No results for input(s): "GLUCAP" in the last 168 hours.  Discharge time spent: greater than 30 minutes.  Signed: Tyrone Nine, MD [facility] 07/23/2022

## 2022-07-23 NOTE — Progress Notes (Signed)
Patient has rested well with family at bedside.  Oxygen.  No acute events over night.

## 2022-07-24 LAB — CBC WITH DIFFERENTIAL/PLATELET
Abs Immature Granulocytes: 0.27 10*3/uL — ABNORMAL HIGH (ref 0.00–0.07)
Basophils Absolute: 0 10*3/uL (ref 0.0–0.1)
Basophils Relative: 0 %
Eosinophils Absolute: 0 10*3/uL (ref 0.0–0.5)
Eosinophils Relative: 0 %
HCT: 29.7 % — ABNORMAL LOW (ref 39.0–52.0)
Hemoglobin: 10.3 g/dL — ABNORMAL LOW (ref 13.0–17.0)
Immature Granulocytes: 2 %
Lymphocytes Relative: 4 %
Lymphs Abs: 0.5 10*3/uL — ABNORMAL LOW (ref 0.7–4.0)
MCH: 31.4 pg (ref 26.0–34.0)
MCHC: 34.7 g/dL (ref 30.0–36.0)
MCV: 90.5 fL (ref 80.0–100.0)
Monocytes Absolute: 0.6 10*3/uL (ref 0.1–1.0)
Monocytes Relative: 5 %
Neutro Abs: 11.2 10*3/uL — ABNORMAL HIGH (ref 1.7–7.7)
Neutrophils Relative %: 89 %
Platelets: 149 10*3/uL — ABNORMAL LOW (ref 150–400)
RBC: 3.28 MIL/uL — ABNORMAL LOW (ref 4.22–5.81)
RDW: 15.8 % — ABNORMAL HIGH (ref 11.5–15.5)
WBC: 12.6 10*3/uL — ABNORMAL HIGH (ref 4.0–10.5)
nRBC: 0 % (ref 0.0–0.2)

## 2022-07-24 LAB — COMPREHENSIVE METABOLIC PANEL
ALT: 27 U/L (ref 0–44)
AST: 24 U/L (ref 15–41)
Albumin: 2 g/dL — ABNORMAL LOW (ref 3.5–5.0)
Alkaline Phosphatase: 49 U/L (ref 38–126)
Anion gap: 6 (ref 5–15)
BUN: 22 mg/dL — ABNORMAL HIGH (ref 6–20)
CO2: 21 mmol/L — ABNORMAL LOW (ref 22–32)
Calcium: 7.5 mg/dL — ABNORMAL LOW (ref 8.9–10.3)
Chloride: 97 mmol/L — ABNORMAL LOW (ref 98–111)
Creatinine, Ser: 0.86 mg/dL (ref 0.61–1.24)
GFR, Estimated: >60 mL/min (ref >60)
Glucose, Bld: 104 mg/dL — ABNORMAL HIGH (ref 70–99)
Potassium: 4.2 mmol/L (ref 3.5–5.1)
Sodium: 124 mmol/L — ABNORMAL LOW (ref 135–145)
Total Bilirubin: 0.7 mg/dL (ref 0.3–1.2)
Total Protein: 4.8 g/dL — ABNORMAL LOW (ref 6.5–8.1)

## 2022-07-24 MED ORDER — FUROSEMIDE 20 MG PO TABS: 20.0000 mg | ORAL_TABLET | Freq: Every day | ORAL | 0 refills | Status: AC

## 2022-07-24 NOTE — ED Provider Notes (Signed)
[place] EMERGENCY DEPARTMENT AT [facility] Provider Note   CSN: [id number] Arrival date & time: 07/23/22  2237     History  Chief Complaint  Patient presents with   Groin Swelling    DAVIED HAISLIP is a 62 y.o. male.  Patient is a 62 year old male with history of recently diagnosed metastatic lung cancer.  Patient discharged earlier this morning after being admitted for SVT, fatigue, and recent falls.  Since discharge, patient reports that he has not urinated and is now having swelling of his scrotum.  He denies fevers or chills.    The history is provided by the patient.       Home Medications Prior to Admission medications   Medication Sig Start Date End Date Taking? Authorizing Provider  amiodarone (PACERONE) 200 MG tablet Take 1 tablet (200 mg total) by mouth 2 (two) times daily for 14 days, THEN 1 tablet (200 mg total) daily for 16 days. and thereafter. 07/23/22 08/22/22  Tyrone Nine, MD  folic acid (FOLVITE) 1 MG tablet Take 1 tablet (1 mg total) by mouth daily. 07/13/22   Doreatha Massed, MD  ipratropium-albuterol (DUONEB) 0.5-2.5 (3) MG/3ML SOLN Take 3 mLs by nebulization every 4 (four) hours as needed. 07/23/22   Tyrone Nine, MD  megestrol (MEGACE) 400 MG/10ML suspension Take 10 mLs (400 mg total) by mouth 2 (two) times daily. 07/13/22   Doreatha Massed, MD  metoprolol succinate (TOPROL-XL) 25 MG 24 hr tablet Take 1 tablet (25 mg total) by mouth daily. 07/24/22   Tyrone Nine, MD  predniSONE (DELTASONE) 20 MG tablet Take 3 tablets (60 mg total) by mouth daily with breakfast for 2 days, THEN 2 tablets (40 mg total) daily with breakfast for 2 days, THEN 1 tablet (20 mg total) daily with breakfast for 2 days. 07/23/22 07/29/22  Tyrone Nine, MD  sacubitril-valsartan (ENTRESTO) 49-51 MG Take 1 tablet by mouth 2 (two) times daily. 07/23/22   Tyrone Nine, MD  tamsulosin (FLOMAX) 0.4 MG CAPS capsule Take 1 capsule (0.4 mg total) by mouth daily after  supper. Patient not taking: Reported on 07/15/2022 06/08/22   Doreatha Massed, MD      Allergies    Patient has no known allergies.    Review of Systems   Review of Systems  All other systems reviewed and are negative.   Physical Exam Updated Vital Signs BP 112/71 (BP Location: Left Arm)   Pulse 82   Temp 98.2 F (36.8 C) (Oral)   Resp 18   Ht 5\' 6"  (1.676 m)   Wt 58.1 kg   SpO2 95%   BMI 20.66 kg/m  Physical Exam Vitals and nursing note reviewed.  Constitutional:      General: He is not in acute distress.    Appearance: He is well-developed. He is not diaphoretic.     Comments: Patient is thin and chronically ill-appearing.  HENT:     Head: Normocephalic and atraumatic.  Cardiovascular:     Rate and Rhythm: Normal rate and regular rhythm.     Heart sounds: No murmur heard.    No friction rub.  Pulmonary:     Effort: Pulmonary effort is normal. No respiratory distress.     Breath sounds: Normal breath sounds. No wheezing or rales.  Abdominal:     General: Bowel sounds are normal. There is no distension.     Palpations: Abdomen is soft.     Tenderness: There is no abdominal tenderness.  Genitourinary:    Penis: Normal.      Comments: There is some swelling/edema noted of the scrotum. Musculoskeletal:        General: Normal range of motion.     Cervical back: Normal range of motion and neck supple.  Skin:    General: Skin is warm and dry.  Neurological:     Mental Status: He is alert and oriented to person, place, and time.     Coordination: Coordination normal.     ED Results / Procedures / Treatments   Labs (all labs ordered are listed, but only abnormal results are displayed) Labs Reviewed  COMPREHENSIVE METABOLIC PANEL  CBC WITH DIFFERENTIAL/PLATELET    EKG None  Radiology No results found.  Procedures Procedures    Medications Ordered in ED Medications - No data to display  ED Course/ Medical Decision Making/ A&P  Patient is a  62 year old male with past medical history of recently diagnosed metastatic lung cancer.  Patient was recently admitted to the hospital for SVT, fatigue, and generally overall poor health.  It seems as though the oncologist is wanting him to regain some strength prior to undergoing palliative chemotherapy.  Patient was discharged earlier today, now returns with swelling of his leg and scrotum.  He arrives here with stable vital signs.  He is afebrile and there is no hypoxia on his oxygen at 2 L nasal cannula.  On exam, he does have 1+ pitting edema of the ankles and some swelling noted of the scrotum.  Workup initiated here including CBC, metabolic panel, both of which are consistent with discharge.  Bladder scan also performed showing just over 100 cc of urine in the bladder.  This was performed as the daughter stated he had not urinated since he arrived home.  Patient is an overall poor health with a likely poor prognosis.  I see no acute change in his state of health from discharge this afternoon.  He has palpable DP pulses bilaterally and the feet are warm to the touch.  Perfusion seems adequate.  I suspect patient is developing some edema related to his overall condition.  I have decided to treat with a low-dose of Lasix for the next few days and have him followed up by his oncologist.  Final Clinical Impression(s) / ED Diagnoses Final diagnoses:  None    Rx / DC Orders ED Discharge Orders     None         Geoffery Lyons, MD 07/24/22 [phone]

## 2022-07-24 NOTE — ED Notes (Signed)
Bladder scan complete 

## 2022-07-24 NOTE — Discharge Instructions (Signed)
Begin taking Lasix as prescribed.  Continue other medications as previously prescribed.  Follow-up with primary doctor/oncologist in the next few days.

## 2022-07-27 ENCOUNTER — Inpatient Hospital Stay: Payer: Medicaid Other | Admitting: Hematology

## 2022-07-27 NOTE — Progress Notes (Signed)
[employer] F[employer] papers completed and faxed with confirmation received.

## 2022-07-30 ENCOUNTER — Other Ambulatory Visit: Payer: Medicaid Other

## 2022-07-30 ENCOUNTER — Other Ambulatory Visit: Payer: Medicaid Other | Admitting: Licensed Clinical Social Worker

## 2022-07-30 ENCOUNTER — Telehealth: Payer: Self-pay

## 2022-07-30 ENCOUNTER — Ambulatory Visit: Payer: Medicaid Other | Admitting: Physician Assistant

## 2022-07-30 NOTE — Telephone Encounter (Signed)
1541 Palliative Care Note  Spoke with Tommi, RN at Dr. Ginger Organ office regarding palliative care referral. Verbal order given to start palliative care services in the home for this patient.  Barbette Merino, RN

## 2022-08-01 ENCOUNTER — Other Ambulatory Visit: Payer: Self-pay

## 2022-08-03 ENCOUNTER — Other Ambulatory Visit: Payer: Self-pay | Admitting: *Deleted

## 2022-08-03 DIAGNOSIS — C3491 Malignant neoplasm of unspecified part of right bronchus or lung: Secondary | ICD-10-CM

## 2022-08-03 DIAGNOSIS — C799 Secondary malignant neoplasm of unspecified site: Secondary | ICD-10-CM

## 2022-08-03 DIAGNOSIS — C3490 Malignant neoplasm of unspecified part of unspecified bronchus or lung: Secondary | ICD-10-CM

## 2022-08-04 ENCOUNTER — Telehealth: Payer: Self-pay

## 2022-08-04 NOTE — Telephone Encounter (Signed)
TC to patient/family to discuss and review PC referral. Daughter, Lowanda Foster, shares that patient is now under [facility].   [facility] will cancel palliative care referral and discharge.

## 2022-08-06 ENCOUNTER — Ambulatory Visit: Payer: Medicaid Other | Admitting: Cardiology

## 2022-08-11 ENCOUNTER — Inpatient Hospital Stay: Payer: Medicaid Other

## 2022-08-11 ENCOUNTER — Inpatient Hospital Stay: Payer: Medicaid Other | Admitting: Hematology

## 2022-08-17 ENCOUNTER — Inpatient Hospital Stay: Payer: Medicaid Other | Admitting: Dietician

## 2022-09-01 ENCOUNTER — Inpatient Hospital Stay: Payer: Medicaid Other | Admitting: Hematology

## 2022-09-01 ENCOUNTER — Inpatient Hospital Stay: Payer: Medicaid Other

## 2022-09-14 DEATH — deceased

## 2022-10-01 ENCOUNTER — Other Ambulatory Visit: Payer: Self-pay

## 2023-05-08 ENCOUNTER — Other Ambulatory Visit: Payer: Self-pay

## 2023-05-08 NOTE — Progress Notes (Signed)
 Updated pemetrexed ERX to [id number]   Pryor Ochoa, PharmD 05/08/23
